# Patient Record
Sex: Female | Born: 1985
Health system: Southern US, Community
[De-identification: ages and names within clinical notes are randomized; demographics above are authoritative.]

## PROBLEM LIST (undated history)

## (undated) DIAGNOSIS — F32A Depression, unspecified: Secondary | ICD-10-CM

## (undated) DIAGNOSIS — R519 Headache, unspecified: Secondary | ICD-10-CM

## (undated) DIAGNOSIS — K259 Gastric ulcer, unspecified as acute or chronic, without hemorrhage or perforation: Secondary | ICD-10-CM

## (undated) DIAGNOSIS — F329 Major depressive disorder, single episode, unspecified: Secondary | ICD-10-CM

## (undated) DIAGNOSIS — F909 Attention-deficit hyperactivity disorder, unspecified type: Secondary | ICD-10-CM

## (undated) DIAGNOSIS — F419 Anxiety disorder, unspecified: Secondary | ICD-10-CM

## (undated) DIAGNOSIS — IMO0002 Reserved for concepts with insufficient information to code with codable children: Secondary | ICD-10-CM

## (undated) DIAGNOSIS — E079 Disorder of thyroid, unspecified: Secondary | ICD-10-CM

## (undated) HISTORY — DX: Depression, unspecified: F32.A

## (undated) HISTORY — DX: Reserved for concepts with insufficient information to code with codable children: IMO0002

## (undated) HISTORY — PX: NO PAST SURGERIES: SHX2092

## (undated) HISTORY — DX: Anxiety disorder, unspecified: F41.9

## (undated) HISTORY — DX: Attention-deficit hyperactivity disorder, unspecified type: F90.9

## (undated) HISTORY — PX: WISDOM TOOTH EXTRACTION: SHX21

## (undated) HISTORY — DX: Major depressive disorder, single episode, unspecified: F32.9

## (undated) HISTORY — DX: Disorder of thyroid, unspecified: E07.9

## (undated) HISTORY — DX: Gastric ulcer, unspecified as acute or chronic, without hemorrhage or perforation: K25.9

---
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
## 2007-01-19 ENCOUNTER — Ambulatory Visit: Payer: Self-pay | Admitting: Family Medicine

## 2007-02-06 ENCOUNTER — Telehealth: Payer: Self-pay | Admitting: Family Medicine

## 2007-03-21 ENCOUNTER — Telehealth: Payer: Self-pay | Admitting: Family Medicine

## 2007-04-18 ENCOUNTER — Ambulatory Visit: Payer: Self-pay | Admitting: Family Medicine

## 2007-04-18 DIAGNOSIS — J029 Acute pharyngitis, unspecified: Secondary | ICD-10-CM | POA: Insufficient documentation

## 2007-04-18 LAB — CONVERTED CEMR LAB
Heterophile Ab Screen: NEGATIVE
Rapid Strep: NEGATIVE

## 2007-04-23 LAB — CONVERTED CEMR LAB
ALT: 15 units/L (ref 0–35)
AST: 14 units/L (ref 0–37)
Albumin: 3.9 g/dL (ref 3.5–5.2)
Alkaline Phosphatase: 54 units/L (ref 39–117)
BUN: 2 mg/dL — ABNORMAL LOW (ref 6–23)
Basophils Absolute: 0 10*3/uL (ref 0.0–0.1)
Basophils Relative: 0.3 % (ref 0.0–1.0)
Bilirubin, Direct: 0.1 mg/dL (ref 0.0–0.3)
CO2: 31 meq/L (ref 19–32)
Calcium: 9.5 mg/dL (ref 8.4–10.5)
Chloride: 107 meq/L (ref 96–112)
Creatinine, Ser: 0.7 mg/dL (ref 0.4–1.2)
EBV NA IgG: 3.04 — ABNORMAL HIGH
EBV VCA IgG: 2.91 — ABNORMAL HIGH
EBV VCA IgM: 0.37
Eosinophils Absolute: 0.1 10*3/uL (ref 0.0–0.6)
Eosinophils Relative: 2.2 % (ref 0.0–5.0)
GFR calc Af Amer: 139 mL/min
GFR calc non Af Amer: 115 mL/min
Glucose, Bld: 74 mg/dL (ref 70–99)
HCT: 37.3 % (ref 36.0–46.0)
Hemoglobin: 12.9 g/dL (ref 12.0–15.0)
Lymphocytes Relative: 40.2 % (ref 12.0–46.0)
MCHC: 34.7 g/dL (ref 30.0–36.0)
MCV: 91.1 fL (ref 78.0–100.0)
Monocytes Absolute: 0.4 10*3/uL (ref 0.2–0.7)
Monocytes Relative: 7.8 % (ref 3.0–11.0)
Neutro Abs: 2.4 10*3/uL (ref 1.4–7.7)
Neutrophils Relative %: 49.5 % (ref 43.0–77.0)
Platelets: 217 10*3/uL (ref 150–400)
Potassium: 5 meq/L (ref 3.5–5.1)
RBC: 4.09 M/uL (ref 3.87–5.11)
RDW: 11.6 % (ref 11.5–14.6)
Sodium: 143 meq/L (ref 135–145)
Total Bilirubin: 0.7 mg/dL (ref 0.3–1.2)
Total Protein: 6.8 g/dL (ref 6.0–8.3)
WBC: 4.9 10*3/uL (ref 4.5–10.5)

## 2007-05-02 ENCOUNTER — Telehealth: Payer: Self-pay | Admitting: Family Medicine

## 2007-06-05 ENCOUNTER — Encounter: Payer: Self-pay | Admitting: Family Medicine

## 2007-06-05 ENCOUNTER — Encounter: Admission: RE | Admit: 2007-06-05 | Discharge: 2007-06-05 | Payer: Self-pay | Admitting: Family Medicine

## 2007-06-12 ENCOUNTER — Encounter: Payer: Self-pay | Admitting: Family Medicine

## 2007-06-18 ENCOUNTER — Encounter: Admission: RE | Admit: 2007-06-18 | Discharge: 2007-06-18 | Payer: Self-pay | Admitting: Urology

## 2007-06-19 ENCOUNTER — Telehealth: Payer: Self-pay | Admitting: Family Medicine

## 2007-07-23 ENCOUNTER — Telehealth: Payer: Self-pay | Admitting: Family Medicine

## 2007-07-24 ENCOUNTER — Telehealth: Payer: Self-pay | Admitting: Family Medicine

## 2007-08-01 ENCOUNTER — Telehealth: Payer: Self-pay | Admitting: Family Medicine

## 2007-10-24 ENCOUNTER — Telehealth: Payer: Self-pay | Admitting: Family Medicine

## 2007-11-27 ENCOUNTER — Telehealth: Payer: Self-pay | Admitting: Family Medicine

## 2008-01-14 ENCOUNTER — Telehealth: Payer: Self-pay | Admitting: Family Medicine

## 2008-01-24 ENCOUNTER — Ambulatory Visit: Payer: Self-pay | Admitting: Family Medicine

## 2008-02-19 DIAGNOSIS — N3010 Interstitial cystitis (chronic) without hematuria: Secondary | ICD-10-CM | POA: Insufficient documentation

## 2008-02-19 DIAGNOSIS — L708 Other acne: Secondary | ICD-10-CM | POA: Insufficient documentation

## 2008-04-23 ENCOUNTER — Telehealth: Payer: Self-pay | Admitting: Family Medicine

## 2008-05-01 ENCOUNTER — Encounter: Admission: RE | Admit: 2008-05-01 | Discharge: 2008-05-01 | Payer: Self-pay | Admitting: Family Medicine

## 2008-07-16 ENCOUNTER — Telehealth: Payer: Self-pay | Admitting: Family Medicine

## 2009-01-06 ENCOUNTER — Telehealth: Payer: Self-pay | Admitting: Family Medicine

## 2009-01-09 ENCOUNTER — Ambulatory Visit: Payer: Self-pay | Admitting: Family Medicine

## 2009-01-09 DIAGNOSIS — L708 Other acne: Secondary | ICD-10-CM | POA: Insufficient documentation

## 2009-01-09 DIAGNOSIS — R079 Chest pain, unspecified: Secondary | ICD-10-CM | POA: Insufficient documentation

## 2009-07-14 ENCOUNTER — Telehealth: Payer: Self-pay | Admitting: Family Medicine

## 2009-07-22 ENCOUNTER — Encounter: Payer: Self-pay | Admitting: Family Medicine

## 2009-08-03 ENCOUNTER — Telehealth: Payer: Self-pay | Admitting: Family Medicine

## 2010-02-08 ENCOUNTER — Telehealth: Payer: Self-pay | Admitting: Family Medicine

## 2010-02-10 ENCOUNTER — Ambulatory Visit: Payer: Self-pay | Admitting: Family Medicine

## 2010-07-11 NOTE — L&D Delivery Note (Signed)
Delivery Note  Several prolonged decels to 60's while pushing that resolved with position changes, NICU team was called to room secondary to decels   At 11:21 PM a viable female was delivered via Vaginal, Spontaneous Delivery (Presentation: ; Occiput Anterior).  Loose nuchal cord was reduced, there was a compound presentation of L arm Cord was doubly clamped and cut and infant was handed to awaiting NICU staff for assessment   APGAR: 7, 9; weight 6 lb 11.9 oz (3060 g).   Placenta status: Intact, Spontaneous.  Cord: 3 vessels with the following complications: None.    Anesthesia: Epidural  Episiotomy: None Lacerations: 1st degree Suture Repair: 3.0 vicryl rapide Est. Blood Loss (mL): 200  Mom to postpartum.  Baby to nursery-stable. Dr Estanislado Pandy notified Pt desires OP circ  Malissa Hippo 05/23/2011, 12:42 AM

## 2010-08-10 NOTE — Progress Notes (Signed)
Summary: REQ FOR REFILL ON MED  Phone Note Call from Patient   Caller: Patients Mom [phone] Reason for Call: Refill Medication Summary of Call: Pts mom called to adv that her daughter req a refill RX for her med (ADDERALL 20 MG  TABS).... Pts mom states that her daughters insurance Herbalist) has sent a letter that indicates that there may be a need for documentation stating that pt still needs the med in order to renew the RX.... Pts mom adv that if refill RX can be prepared for her daughter then she will come and pick it up.... Can you advise whether pt will need to come in for OV? (Last appt was in 01/2009).  Initial call taken by: Debbra Riding,  July 14, 2009 1:44 PM  Follow-up for Phone Call        I refilled this for 3 months Follow-up by: Nelwyn Salisbury MD,  July 15, 2009 5:18 PM  Additional Follow-up for Phone Call Additional follow up Details #1::        rx up front ready for p/u, pt aware Additional Follow-up by: Alfred Levins, CMA,  July 15, 2009 5:20 PM    New/Updated Medications: ADDERALL 20 MG  TABS (AMPHETAMINE-DEXTROAMPHETAMINE) two times a day ADDERALL 20 MG  TABS (AMPHETAMINE-DEXTROAMPHETAMINE) two times a day, may fill on 08-15-09 ADDERALL 20 MG  TABS (AMPHETAMINE-DEXTROAMPHETAMINE) two times a day, may fill on 09-12-09 Prescriptions: ADDERALL 20 MG  TABS (AMPHETAMINE-DEXTROAMPHETAMINE) two times a day, may fill on 09-12-09  #60 x 0   Entered and Authorized by:   Nelwyn Salisbury MD   Signed by:   Nelwyn Salisbury MD on 07/15/2009   Method used:   Print then Give to Patient   RxID:   [id number] ADDERALL 20 MG  TABS (AMPHETAMINE-DEXTROAMPHETAMINE) two times a day, may fill on 08-15-09  #60 x 0   Entered and Authorized by:   Nelwyn Salisbury MD   Signed by:   Nelwyn Salisbury MD on 07/15/2009   Method used:   Print then Give to Patient   RxID:   [id number] ADDERALL 20 MG  TABS (AMPHETAMINE-DEXTROAMPHETAMINE) two times a day  #60 x 0   Entered and Authorized  by:   Nelwyn Salisbury MD   Signed by:   Nelwyn Salisbury MD on 07/15/2009   Method used:   Print then Give to Patient   RxID:   [id number]   Appended Document: Prior Auth Need    Phone Note Call from Patient Call back at Home Phone [phone]   Caller: Mom- Walk in  Summary of Call: Mom states pt needs prior authorization for amphetamine salt combo 20mg .  Initial call taken by: Trixie Dredge,  July 23, 2009 11:01 AM  Follow-up for Phone Call        [facility] [id number])and was advised authorization for this med was approved from 07/01/09 - 07/22/2010, case # [id number].  Returned called to patient advising her of this. Follow-up by: Trixie Dredge,  July 23, 2009 11:03 AM

## 2010-08-10 NOTE — Progress Notes (Signed)
Summary: written rx for xanax  Phone Note Call from Patient Call back at Home Phone [phone]   Caller: Patient Call For: fry Summary of Call: pt wants a refill on her xanax, written rx for p/u, call when ready Initial call taken by: Alfred Levins, CMA,  August 03, 2009 5:29 PM  Follow-up for Phone Call        done Follow-up by: Nelwyn Salisbury MD,  August 04, 2009 1:24 PM  Additional Follow-up for Phone Call Additional follow up Details #1::        rx up front ready for p/u, pt aware Additional Follow-up by: Alfred Levins, CMA,  August 05, 2009 8:54 AM    New/Updated Medications: ALPRAZOLAM 1 MG TABS (ALPRAZOLAM) two times a day Prescriptions: ALPRAZOLAM 1 MG TABS (ALPRAZOLAM) two times a day  #60 x 5   Entered and Authorized by:   Nelwyn Salisbury MD   Signed by:   Nelwyn Salisbury MD on 08/04/2009   Method used:   Print then Give to Patient   RxID:   [id number]

## 2010-08-10 NOTE — Assessment & Plan Note (Signed)
Summary: st/?fever/njr  Medications Added VYVANSE 20 MG  CAPS (LISDEXAMFETAMINE DIMESYLATE) once daily        Vital Signs:  Patient Profile:   25 Years Old Female Weight:      121 pounds Temp:     97.9 degrees F oral Pulse rate:   80 / minute BP sitting:   120 / 80  (left arm) Cuff size:   regular  Vitals Entered By: Pura Spice, RN (April 18, 2007 10:35 AM)                 Chief Complaint:  sore throat x 1 1/2 wks. cough with yelowish green sputum.. Smoker 1/2 -1 ppd. .  History of Present Illness: 10 days of severe ST, body aches, intermittent fever, and slight cough producing yellow sputum. On Tylenol and fluids. Also wants to switch from Adderall to Vyvanse. Adderall always made her irritable, and her mother recently tried Vyvanse with good results.    Past Medical History:    Reviewed history from 02/09/2007 and no changes required:       Anxiety       ADHD     Review of Systems      See HPI   Physical Exam  General:     Well-developed,well-nourished,in no acute distress; alert,appropriate and cooperative throughout examination Head:     Normocephalic and atraumatic without obvious abnormalities. No apparent alopecia or balding. Eyes:     No corneal or conjunctival inflammation noted. EOMI. Perrla. Funduscopic exam benign, without hemorrhages, exudates or papilledema. Vision grossly normal. Ears:     External ear exam shows no significant lesions or deformities.  Otoscopic examination reveals clear canals, tympanic membranes are intact bilaterally without bulging, retraction, inflammation or discharge. Hearing is grossly normal bilaterally. Nose:     External nasal examination shows no deformity or inflammation. Nasal mucosa are pink and moist without lesions or exudates. Mouth:     pharyngeal erythema.  No exudate Neck:     No deformities, masses, or tenderness noted. Abdomen:     Bowel sounds positive,abdomen soft and non-tender without masses,  organomegaly or hernias noted. Cervical Nodes:     shotty bilateral nodes Psych:     Cognition and judgment appear intact. Alert and cooperative with normal attention span and concentration. No apparent delusions, illusions, hallucinations    Impression & Recommendations:  Problem # 1:  SORE THROAT (ICD-462)  Orders: Rapid Strep (45409) Monospot (81191) T-Culture, Throat (47829-56213) T- * Misc. Laboratory test [phone]) TLB-CBC Platelet - w/Differential (85025-CBCD) TLB-Hepatic/Liver Function Pnl (80076-HEPATIC) TLB-BMP (Basic Metabolic Panel-BMET) (80048-METABOL)   Problem # 2:  ADHD (ICD-314.01)  Complete Medication List: 1)  Xanax 0.25 Mg Tabs (Alprazolam) .... As needed 2)  Ortho Tri-cyclen Lo 0.025 Mg Tabs (Norgestimate-ethinyl estradiol) 3)  Erythromycin 2 % Soln (Erythromycin) 4)  Vyvanse 20 Mg Caps (Lisdexamfetamine dimesylate) .... Once daily  Other Orders: Nutrition Referral (Nutrition)   Patient Instructions: 1)  Please schedule a follow-up appointment as needed. 2)  Drink as much fluid as you can tolerate for the next few days. Wrote her out of school and work until at least 04-23-07.    Prescriptions: VYVANSE 20 MG  CAPS (LISDEXAMFETAMINE DIMESYLATE) once daily  #30 x 0   Entered and Authorized by:   Nelwyn Salisbury MD   Signed by:   Nelwyn Salisbury MD on 04/18/2007   Method used:   Print then Give to Patient   RxID:   [id number]  ]  Laboratory Results   Blood Tests      Mono: negative Comments: ...................................................................Milica Zimonjic  April 18, 2007 10:49 AM     Other Tests  Rapid Strep: negative Comments ...................................................................Milica Zimonjic  April 18, 2007 10:50 AM    Appended Document: st/?fever/njr

## 2010-08-10 NOTE — Progress Notes (Signed)
Summary: CHANGE IN RX  Medications Added VYVANSE 30 MG  CAPS (LISDEXAMFETAMINE DIMESYLATE) once daily       Phone Note Call from Patient Call back at Home Phone [phone]   Caller: Patient Call For: FRY Summary of Call: ON LAST VISIT PRESCRIBED  VYVANSE 20 MG SAID TO LET KNOW HOW THE DOSE IS DOING  20MG  DOES NOT SEEM LIKE ENOUGH  TAKES AT 6 AND BY 2 SHE IS DONE WHEN HE WRITES 3 MONTH RX PLEASE MOVE TO 30 MG Initial call taken by: Roselle Locus,  May 02, 2007 9:45 AM  Follow-up for Phone Call        I wrote for one month only of 30mg . Let me know how it does before I write for 3 months Follow-up by: Nelwyn Salisbury MD,  May 02, 2007 11:59 AM  Additional Follow-up for Phone Call Additional follow up Details #1::        Phone Call Completed Additional Follow-up by: Alfred Levins, CMA,  May 02, 2007 1:27 PM    New/Updated Medications: VYVANSE 30 MG  CAPS (LISDEXAMFETAMINE DIMESYLATE) once daily   Prescriptions: VYVANSE 30 MG  CAPS (LISDEXAMFETAMINE DIMESYLATE) once daily  #30 x 0   Entered and Authorized by:   Nelwyn Salisbury MD   Signed by:   Nelwyn Salisbury MD on 05/02/2007   Method used:   Print then Give to Patient   RxID:   [id number]

## 2010-08-10 NOTE — Progress Notes (Signed)
Summary: med refill  Phone Note Call from Patient Call back at Home Phone [phone]   Caller: Patient Call For: dr fry Summary of Call: pt is requesting written rx for generic adderall 10mg  #60  Initial call taken by: Heron Sabins,  February 06, 2007 2:17 PM  Follow-up for Phone Call        need chart please  Additional Follow-up for Phone Call Additional follow up Details #1::        done Additional Follow-up by: Nelwyn Salisbury MD,  February 07, 2007 5:49 PM    New/Updated Medications: ADDERALL 10 MG  TABS (AMPHETAMINE-DEXTROAMPHETAMINE) two times a day  Prescriptions: ADDERALL 10 MG  TABS (AMPHETAMINE-DEXTROAMPHETAMINE) two times a day  #60 x 0   Entered and Authorized by:   Nelwyn Salisbury MD   Signed by:   Nelwyn Salisbury MD on 02/07/2007   Method used:   Print then Give to Patient   RxID:   [id number]   Appended Document: med refill called patient at [phone] and LM that adderall rx is at front window for pickup.

## 2010-08-10 NOTE — Assessment & Plan Note (Signed)
Summary: fu on med/njr   Vital Signs:  Patient profile:   25 year old female Weight:      120 pounds BP sitting:   96 / 66  (left arm) Cuff size:   regular  Vitals Entered By: Raechel Ache, RN (February 10, 2010 1:43 PM) CC: F/u meds.   History of Present Illness: Here to follow up on ADHD and anxiety. She is doing well in general. She recently was diagnosed with asthma by a physician in [place], and of course he recommended she quit smoking. She is currently seeing an acuupuncture specialist to help with this. She is waitressing 6 days a week. Her moods are good, and she sleeps well.   Preventive Screening-Counseling & Management  Alcohol-Tobacco     Smoking Cessation Counseling: YES  Allergies: 1)  Codeine Phosphate (Codeine Phosphate)  Past History:  Past Medical History: Anxiety ADHD interstitial cystitis, sees Dr. Patsi Sears sees [facility] in [facility] for gyn exams acne Asthma, saw Dr. Lacie Scotts in [place], [place]  Past Surgical History: Reviewed history from 01/24/2008 and no changes required. Denies surgical history  Review of Systems  The patient denies anorexia, fever, weight loss, weight gain, vision loss, decreased hearing, hoarseness, chest pain, syncope, dyspnea on exertion, peripheral edema, prolonged cough, headaches, hemoptysis, abdominal pain, melena, hematochezia, severe indigestion/heartburn, hematuria, incontinence, genital sores, muscle weakness, suspicious skin lesions, transient blindness, difficulty walking, depression, unusual weight change, abnormal bleeding, enlarged lymph nodes, angioedema, breast masses, and testicular masses.    Physical Exam  General:  Well-developed,well-nourished,in no acute distress; alert,appropriate and cooperative throughout examination Neck:  No deformities, masses, or tenderness noted. Lungs:  Normal respiratory effort, chest expands symmetrically. Lungs are clear to auscultation, no crackles or wheezes. Heart:   Normal rate and regular rhythm. S1 and S2 normal without gallop, murmur, click, rub or other extra sounds. Neurologic:  alert & oriented X3, cranial nerves II-XII intact, and gait normal.   Psych:  Cognition and judgment appear intact. Alert and cooperative with normal attention span and concentration. No apparent delusions, illusions, hallucinations   Impression & Recommendations:  Problem # 1:  ASTHMA (ICD-493.90)  Her updated medication list for this problem includes:    Dulera 100-5 Mcg/act Aero (Mometasone furo-formoterol fum) ..... Use two times a day    Advair Diskus 100-50 Mcg/dose Aepb (Fluticasone-salmeterol) ..... Use once daily  Problem # 2:  ADHD (ICD-314.01)  Problem # 3:  ANXIETY (ICD-300.00)  Her updated medication list for this problem includes:    Alprazolam 1 Mg Tabs (Alprazolam) .Marland Kitchen..Marland Kitchen Two times a day  Complete Medication List: 1)  Erythromycin 2 % Soln (Erythromycin) .... Apply once daily 2)  Alprazolam 1 Mg Tabs (Alprazolam) .... Two times a day 3)  Adderall 20 Mg Tabs (Amphetamine-dextroamphetamine) .... Two times a day, may fill on 04-12-10 4)  Dulera 100-5 Mcg/act Aero (Mometasone furo-formoterol fum) .... Use two times a day 5)  Advair Diskus 100-50 Mcg/dose Aepb (Fluticasone-salmeterol) .... Use once daily  Patient Instructions: 1)  Meds were refilled.  2)  Tobacco is very bad for your health and your loved ones ! You should stop smoking !  Prescriptions: ADDERALL 20 MG  TABS (AMPHETAMINE-DEXTROAMPHETAMINE) two times a day, may fill on 04-12-10  #60 x 0   Entered and Authorized by:   Nelwyn Salisbury MD   Signed by:   Nelwyn Salisbury MD on 02/10/2010   Method used:   Print then Give to Patient   RxID:  [id number] ADDERALL 20 MG  TABS (AMPHETAMINE-DEXTROAMPHETAMINE) two times a day, may fill on 03-13-10  #60 x 0   Entered and Authorized by:   Nelwyn Salisbury MD   Signed by:   Nelwyn Salisbury MD on 02/10/2010   Method used:   Print then Give to Patient    RxID:   [id number] ADDERALL 20 MG  TABS (AMPHETAMINE-DEXTROAMPHETAMINE) two times a day  #60 x 0   Entered and Authorized by:   Nelwyn Salisbury MD   Signed by:   Nelwyn Salisbury MD on 02/10/2010   Method used:   Print then Give to Patient   RxID:   [id number] ALPRAZOLAM 1 MG TABS (ALPRAZOLAM) two times a day  #60 x 5   Entered and Authorized by:   Nelwyn Salisbury MD   Signed by:   Nelwyn Salisbury MD on 02/10/2010   Method used:   Print then Give to Patient   RxID:   [id number]

## 2010-08-10 NOTE — Progress Notes (Signed)
Summary: refiill  Medications Added VYVANSE 30 MG  CAPS (LISDEXAMFETAMINE DIMESYLATE) once daily, may fill on 09-01-07 VYVANSE 30 MG  CAPS (LISDEXAMFETAMINE DIMESYLATE) once daily, may fill on 09-29-07       Phone Note Call from Patient Call back at Home Phone [phone]   Caller: patient triage message Call For: Clent Ridges  Summary of Call: Vivanse written rx 30mg  qty 30 last fill 06-20-08 please call when rx can be picked up  Initial call taken by: Roselle Locus,  August 01, 2007 3:01 PM  Follow-up for Phone Call        done for 3 months Follow-up by: Nelwyn Salisbury MD,  August 01, 2007 4:34 PM  Additional Follow-up for Phone Call Additional follow up Details #1::        Phone Call Completed Additional Follow-up by: Alfred Levins, CMA,  August 01, 2007 4:37 PM    Orien Mayhall/Updated Medications: VYVANSE 30 MG  CAPS (LISDEXAMFETAMINE DIMESYLATE) once daily, may fill on 09-01-07 VYVANSE 30 MG  CAPS (LISDEXAMFETAMINE DIMESYLATE) once daily, may fill on 09-29-07   Prescriptions: VYVANSE 30 MG  CAPS (LISDEXAMFETAMINE DIMESYLATE) once daily, may fill on 09-29-07  #30 x 0   Entered and Authorized by:   Nelwyn Salisbury MD   Signed by:   Nelwyn Salisbury MD on 08/01/2007   Method used:   Print then Give to Patient   RxID:   [id number] VYVANSE 30 MG  CAPS (LISDEXAMFETAMINE DIMESYLATE) once daily, may fill on 09-01-07  #30 x 0   Entered and Authorized by:   Nelwyn Salisbury MD   Signed by:   Nelwyn Salisbury MD on 08/01/2007   Method used:   Print then Give to Patient   RxID:   [id number] VYVANSE 30 MG  CAPS (LISDEXAMFETAMINE DIMESYLATE) once daily  #30 x 0   Entered and Authorized by:   Nelwyn Salisbury MD   Signed by:   Nelwyn Salisbury MD on 08/01/2007   Method used:   Print then Give to Patient   RxID:   [id number]

## 2010-08-10 NOTE — Letter (Signed)
Summary: [facility]   Imported By: Kassie Mends 07/03/2007 13:19:51  _____________________________________________________________________  External Attachment:    Type:   Image     Comment:   [facility]

## 2010-08-10 NOTE — Medication Information (Signed)
Summary: Coverage Approval for Adderall/[employer]  Coverage Approval for Adderall/[employer]   Imported By: Maryln Gottron 07/27/2009 15:53:55  _____________________________________________________________________  External Attachment:    Type:   Image     Comment:   External Document

## 2010-08-10 NOTE — Progress Notes (Signed)
Summary: refill amphet salts & vyvanse  Phone Note Call from Patient Call back at [phone]   Call For: fry Reason for Call: Refill Medication Summary of Call: Adderall - Amphet Salts 10mg  two times a day #60 and Vyvanse 30mg  one daily #30 Initial call taken by: Rudy Jew, RN,  Nov 27, 2007 2:42 PM  Follow-up for Phone Call        we do not give her Adderall anymore but we changed to Vyvance last year. This is refilled for 3 months. Follow-up by: Nelwyn Salisbury MD,  Nov 27, 2007 6:03 PM  Additional Follow-up for Phone Call Additional follow up Details #1::        rx up front, pt aware Additional Follow-up by: Alfred Levins, CMA,  Nov 28, 2007 8:27 AM    New/Updated Medications: VYVANSE 30 MG  CAPS (LISDEXAMFETAMINE DIMESYLATE) once daily VYVANSE 30 MG  CAPS (LISDEXAMFETAMINE DIMESYLATE) once daily, may fill on 12-28-07 VYVANSE 30 MG  CAPS (LISDEXAMFETAMINE DIMESYLATE) once daily, may fill on 01-27-08   Prescriptions: VYVANSE 30 MG  CAPS (LISDEXAMFETAMINE DIMESYLATE) once daily, may fill on 01-27-08  #30 x 0   Entered and Authorized by:   Nelwyn Salisbury MD   Signed by:   Nelwyn Salisbury MD on 11/27/2007   Method used:   Print then Give to Patient   RxID:   [id number] VYVANSE 30 MG  CAPS (LISDEXAMFETAMINE DIMESYLATE) once daily, may fill on 12-28-07  #30 x 0   Entered and Authorized by:   Nelwyn Salisbury MD   Signed by:   Nelwyn Salisbury MD on 11/27/2007   Method used:   Print then Give to Patient   RxID:   [id number] VYVANSE 30 MG  CAPS (LISDEXAMFETAMINE DIMESYLATE) once daily  #30 x 0   Entered and Authorized by:   Nelwyn Salisbury MD   Signed by:   Nelwyn Salisbury MD on 11/27/2007   Method used:   Print then Give to Patient   RxID:   [id number] VYVANSE 30 MG  CAPS (LISDEXAMFETAMINE DIMESYLATE) once daily, may fill on 09-29-07  #30 x 0   Entered and Authorized by:   Nelwyn Salisbury MD   Signed by:   Nelwyn Salisbury MD on 11/27/2007   Method used:   Print then Give to  Patient   RxID:   [id number]     Appended Document: refill amphet salts & vyvanse    Phone Note Call from Patient Call back at Home Phone [phone]   Caller: Patient Call For: fry Summary of Call: She takes adderall 10mg  on the weekends because the vyvanse is to much.  She thought she told you that when at her office visit Initial call taken by: Alfred Levins, CMA,  Nov 28, 2007 10:22 AM  Follow-up for Phone Call        ok, wrote for #60 to supplement Vyvanse Follow-up by: Nelwyn Salisbury MD,  Nov 28, 2007 10:47 AM  Additional Follow-up for Phone Call Additional follow up Details #1::        rx up front, pt aware Additional Follow-up by: Alfred Levins, CMA,  Nov 28, 2007 10:48 AM    New/Updated Medications: ADDERALL 10 MG  TABS (AMPHETAMINE-DEXTROAMPHETAMINE) two times a day as needed   Prescriptions: ADDERALL 10 MG  TABS (AMPHETAMINE-DEXTROAMPHETAMINE) two times a day as needed  #60 x 0   Entered and Authorized by:   Nelwyn Salisbury MD   Signed  by:   Nelwyn Salisbury MD on 11/28/2007   Method used:   Print then Give to Patient   RxID:   [id number]

## 2010-08-10 NOTE — Progress Notes (Signed)
Summary: reill   Phone Note Call from Patient Call back at Home Phone [phone]   Caller: patient triage message Call For: fry Summary of Call: refill vyvanse 30mg   does she have to pick up written rx  or call in [facility] [phone] if she needs to picklup please call  Initial call taken by: Roselle Locus,  June 19, 2007 1:33 PM  Follow-up for Phone Call        she must pick it up. Done for one month. Follow-up by: Nelwyn Salisbury MD,  June 19, 2007 4:11 PM  Additional Follow-up for Phone Call Additional follow up Details #1::        Phone Call Completed Additional Follow-up by: Alfred Levins, CMA,  June 19, 2007 4:24 PM      Prescriptions: VYVANSE 30 MG  CAPS (LISDEXAMFETAMINE DIMESYLATE) once daily  #30 x 0   Entered and Authorized by:   Nelwyn Salisbury MD   Signed by:   Nelwyn Salisbury MD on 06/19/2007   Method used:   Print then Give to Patient   RxID:   [id number]

## 2010-08-10 NOTE — Letter (Signed)
Summary: [facility]  [facility]   Imported By: Kassie Mends 07/02/2007 16:29:39  _____________________________________________________________________  External Attachment:    Type:   Image     Comment:   [facility]

## 2010-08-10 NOTE — Progress Notes (Signed)
Summary: refill xanax  Phone Note From Pharmacy   Caller: [facility]. #[id number]* Call For: fry  Summary of Call: refill xanax 1mg  1 by mouth two times a day Initial call taken by: Alfred Levins, CMA,  January 06, 2009 1:02 PM  Follow-up for Phone Call        we got a request from her in April to send her records to [facility], so I assume she has a new doctor. What is the situation? Follow-up by: Nelwyn Salisbury MD,  January 06, 2009 4:29 PM  Additional Follow-up for Phone Call Additional follow up Details #1::        pt had switched physicians but has decided to stay with Dr Clent Ridges.  I explained that all her medical care would need to be with Dr Clent Ridges and she understood.  Appt was scheduled for Friday 11:15 Additional Follow-up by: Alfred Levins, CMA,  January 06, 2009 4:40 PM

## 2010-08-10 NOTE — Progress Notes (Signed)
Summary: RX   Phone Note Call from Patient Call back at [phone]   Caller: patient live Call For: fry Summary of Call: patient needs a rx refill for ortha trycilin low 1 day. she is out of them.  [facility]  [phone].  Please caLL Initial call taken by: Celine Ahr,  October 24, 2007 11:35 AM  Follow-up for Phone Call        PT CALLED BACK TODAY AND NEEDS HER BIRTHCONTROL [facility] [address]  [phone] ..................................................................Marland KitchenRoselle Locus  October 25, 2007 10:58 AM  Additional Follow-up for Phone Call Additional follow up Details #1::        Phone Call Completed Additional Follow-up by: Alfred Levins, CMA,  October 25, 2007 12:27 PM      Prescriptions: ORTHO TRI-CYCLEN LO 0.025 MG  TABS (NORGESTIMATE-ETHINYL ESTRADIOL) once daily  #30 x 1   Entered by:   Alfred Levins, CMA   Authorized by:   Nelwyn Salisbury MD   Signed by:   Alfred Levins, CMA on 10/25/2007   Method used:   Electronically sent to ...       [facility]  [phone]*       [address]       [place], [place]  [address]       Ph: [phone] or [phone]       Fax: [phone]   RxID:   [id number]

## 2010-08-10 NOTE — Progress Notes (Signed)
Summary: refill alprazolam  Phone Note Call from Patient Call back at [phone]   Caller: PT LIVE Call For: FRY Summary of Call: PATIENT WOULD LIKE RX REFILL FOR ALPRAZOLAM 1MG  QUIANTY 60.  [facility]. Initial call taken by: Celine Ahr,  July 16, 2008 2:47 PM  Follow-up for Phone Call        call in #60 with 5 rf Follow-up by: Nelwyn Salisbury MD,  July 17, 2008 12:00 PM  Additional Follow-up for Phone Call Additional follow up Details #1::        pharm called Additional Follow-up by: Sindy Guadeloupe RN,  July 17, 2008 12:17 PM    New/Updated Medications: ALPRAZOLAM 1 MG TABS (ALPRAZOLAM) two times a day   Prescriptions: ALPRAZOLAM 1 MG TABS (ALPRAZOLAM) two times a day  #60 x 5   Entered by:   Sindy Guadeloupe RN   Authorized by:   Nelwyn Salisbury MD   Signed by:   Sindy Guadeloupe RN on 07/17/2008   Method used:   Telephoned to ...       [facility]. [phone]* (retail)       [address]       [place], [place]  [address]       Ph: [phone]       Fax: [phone]   RxID:   [id number]

## 2010-08-10 NOTE — Progress Notes (Signed)
Summary: Alprazolam  Phone Note Call from Patient   Caller: Patient Call For: Dr. Clent Ridges Summary of Call: Requesting refill on Alprazolam........states that [facility] said we denied it. Please call her at [phone] Initial call taken by: Lynann Beaver CMA,  January 14, 2008 12:34 PM  Follow-up for Phone Call        we already called in a 6 month supply on 12-24-07 Follow-up by: Nelwyn Salisbury MD,  January 14, 2008 4:38 PM  Additional Follow-up for Phone Call Additional follow up Details #1::        Phone Call Completed Additional Follow-up by: Alfred Levins, CMA,  January 15, 2008 11:46 AM      Prescriptions: ORTHO TRI-CYCLEN LO 0.025 MG  TABS (NORGESTIMATE-ETHINYL ESTRADIOL) once daily  #30 x 0   Entered by:   Alfred Levins, CMA   Authorized by:   Nelwyn Salisbury MD   Signed by:   Alfred Levins, CMA on 01/15/2008   Method used:   Telephoned to ...       [facility] #[id number]*       [address]       [place], [place]  [address]       Ph: [phone]       Fax: [phone]   RxID:   [id number]

## 2010-08-10 NOTE — Assessment & Plan Note (Signed)
Summary: renew meds/cdw   Vital Signs:  Patient profile:   25 year old female Height:      68 inches Weight:      122 pounds BMI:     18.62 Temp:     98.3 degrees F oral Pulse rate:   77 / minute BP sitting:   98 / 64  (left arm) Cuff size:   regular  Vitals Entered By: Alfred Levins, CMA (January 09, 2009 11:33 AM) CC: renew meds, rt rib pain   History of Present Illness: Here for med refills and also for some right side pains. About 2 weeks ago after a large dog jumped on her she began to have intermittent sharp pains in the ribs on the right side. No SOB. takes Motrin at times. No urinary symptoms. Otherwise has been well.   Allergies: 1)  Codeine Phosphate (Codeine Phosphate)  Past History:  Past Medical History: Anxiety ADHD interstitial cystitis, sees Dr. Patsi Sears sees [facility] in [facility] for gyn exams acne  Past Surgical History: Reviewed history from 01/24/2008 and no changes required. Denies surgical history  Review of Systems  The patient denies anorexia, fever, weight loss, weight gain, vision loss, decreased hearing, hoarseness, chest pain, syncope, dyspnea on exertion, peripheral edema, prolonged cough, headaches, hemoptysis, abdominal pain, melena, hematochezia, severe indigestion/heartburn, hematuria, incontinence, genital sores, muscle weakness, suspicious skin lesions, transient blindness, difficulty walking, depression, unusual weight change, abnormal bleeding, enlarged lymph nodes, angioedema, breast masses, and testicular masses.    Physical Exam  General:  Well-developed,well-nourished,in no acute distress; alert,appropriate and cooperative throughout examination Chest Wall:  mildly tender at the inferior margin of right ribs. No crepitus Lungs:  Normal respiratory effort, chest expands symmetrically. Lungs are clear to auscultation, no crackles or wheezes. Heart:  Normal rate and regular rhythm. S1 and S2 normal without gallop, murmur,  click, rub or other extra sounds. Psych:  Cognition and judgment appear intact. Alert and cooperative with normal attention span and concentration. No apparent delusions, illusions, hallucinations   Impression & Recommendations:  Problem # 1:  RIB PAIN (ICD-786.50)  Problem # 2:  ACNE VULGARIS (ICD-706.1)  Her updated medication list for this problem includes:    Erythromycin 2 % Soln (Erythromycin) .Marland Kitchen... Apply once daily  Problem # 3:  ADHD (ICD-314.01)  Problem # 4:  ANXIETY (ICD-300.00)  Her updated medication list for this problem includes:    Alprazolam 1 Mg Tabs (Alprazolam) .Marland Kitchen..Marland Kitchen Two times a day  Complete Medication List: 1)  Ortho Tri-cyclen (28) 0.18/0.215/0.25 Mg-35 Mcg Tabs (Norgestim-eth estrad triphasic) .Marland Kitchen.. 1 by mouth once daily 2)  Erythromycin 2 % Soln (Erythromycin) .... Apply once daily 3)  Alprazolam 1 Mg Tabs (Alprazolam) .... Two times a day 4)  Adderall 20 Mg Tabs (Amphetamine-dextroamphetamine) .... Two times a day, may fill on 03-12-09 5)  Diclofenac Potassium 50 Mg Tabs (Diclofenac potassium) .... Three times a day as needed pain  Patient Instructions: 1)  rest, warm compresses, Doclofenac as needed . 2)  Please schedule a follow-up appointment as needed .  Prescriptions: ADDERALL 20 MG  TABS (AMPHETAMINE-DEXTROAMPHETAMINE) two times a day, may fill on 03-12-09  #60 x 0   Entered and Authorized by:   Nelwyn Salisbury MD   Signed by:   Nelwyn Salisbury MD on 01/09/2009   Method used:   Print then Give to Patient   RxID:   [id number] ADDERALL 20 MG  TABS (AMPHETAMINE-DEXTROAMPHETAMINE) two times a day, may fill on 02-09-09  #  60 x 0   Entered and Authorized by:   Nelwyn Salisbury MD   Signed by:   Nelwyn Salisbury MD on 01/09/2009   Method used:   Print then Give to Patient   RxID:   [id number] DICLOFENAC POTASSIUM 50 MG TABS (DICLOFENAC POTASSIUM) three times a day as needed pain  #60 x 2   Entered and Authorized by:   Nelwyn Salisbury MD   Signed by:    Nelwyn Salisbury MD on 01/09/2009   Method used:   Print then Give to Patient   RxID:   [id number] ADDERALL 20 MG  TABS (AMPHETAMINE-DEXTROAMPHETAMINE) two times a day  #60 x 0   Entered and Authorized by:   Nelwyn Salisbury MD   Signed by:   Nelwyn Salisbury MD on 01/09/2009   Method used:   Print then Give to Patient   RxID:   [id number] ALPRAZOLAM 1 MG TABS (ALPRAZOLAM) two times a day  #60 x 5   Entered and Authorized by:   Nelwyn Salisbury MD   Signed by:   Nelwyn Salisbury MD on 01/09/2009   Method used:   Print then Give to Patient   RxID:   [id number] ERYTHROMYCIN 2 %  SOLN (ERYTHROMYCIN) apply once daily  #30 days x 11   Entered and Authorized by:   Nelwyn Salisbury MD   Signed by:   Nelwyn Salisbury MD on 01/09/2009   Method used:   Print then Give to Patient   RxID:   [id number]

## 2010-08-10 NOTE — Progress Notes (Signed)
Summary: rx wrong mg  Medications Added XANAX 1 MG TABS (ALPRAZOLAM) 1 by mouth two times a day       Phone Note Call from Patient Call back at [phone]   Caller: patient mother live Call For: Clent Ridges  Summary of Call: Got her rx yesterday for her Xanax and they filled the RX before thry realized that it is wrong.  Should have been for #60 with 5 refills and 1 mg not .25mg .   Initial call taken by: Roselle Locus,  July 24, 2007 8:14 AM  Follow-up for Phone Call        please call her pharmacy and change this dose to 1 mg two times a day as above, and change this in her Echart Follow-up by: Nelwyn Salisbury MD,  July 24, 2007 8:23 AM  Additional Follow-up for Phone Call Additional follow up Details #1::        Phone Call Completed, Rx Called In Additional Follow-up by: Alfred Levins, CMA,  July 24, 2007 8:37 AM    New/Updated Medications: XANAX 1 MG TABS (ALPRAZOLAM) 1 by mouth two times a day   Prescriptions: XANAX 1 MG TABS (ALPRAZOLAM) 1 by mouth two times a day  #60 x 5   Entered by:   Alfred Levins, CMA   Authorized by:   Nelwyn Salisbury MD   Signed by:   Alfred Levins, CMA on 07/24/2007   Method used:   Telephoned to ...       [facility]  [address]  [phone]*       [address]       [place], [place]  [address]       Ph: [phone] or [phone]       Fax: [phone]   RxID:   [id number]

## 2010-08-10 NOTE — Progress Notes (Signed)
Summary: refill  Phone Note Call from Patient Call back at Home Phone [phone]   Caller: Patient---live call Summary of Call: need temporary refill alprazolam until her appt on wednesday. [facility] is [facility]---in [employer]---[phone]. Initial call taken by: Warnell Forester,  February 08, 2010 12:58 PM  Follow-up for Phone Call        pt called again. she is out of meds and cannot sleep. Follow-up by: Warnell Forester,  February 09, 2010 10:20 AM  Additional Follow-up for Phone Call Additional follow up Details #1::        call in #60 with no rf Additional Follow-up by: Nelwyn Salisbury MD,  February 09, 2010 1:48 PM    Prescriptions: ALPRAZOLAM 1 MG TABS (ALPRAZOLAM) two times a day  #60 x 0   Entered by:   Josph Macho RMA   Authorized by:   Nelwyn Salisbury MD   Signed by:   Josph Macho RMA on 02/09/2010   Method used:   Telephoned to ...       [facility] [address] [address] [phone]* (retail)       [address] [place], [place]  [id number]       Ph: [phone]       Fax: [phone]   RxID:   [id number]

## 2010-08-10 NOTE — Progress Notes (Signed)
Summary: Adderal  Phone Note Call from Patient   Caller: Patient Call For: Dr Clent Ridges Summary of Call: Pt needs written Adderal refills. [phone] Initial call taken by: Lynann Beaver CMA,  April 23, 2008 4:29 PM  Follow-up for Phone Call        done Follow-up by: Nelwyn Salisbury MD,  April 24, 2008 8:21 AM  Additional Follow-up for Phone Call Additional follow up Details #1::        rx up front, pt aware Additional Follow-up by: Alfred Levins, CMA,  April 24, 2008 8:41 AM    New/Updated Medications: ADDERALL 20 MG  TABS (AMPHETAMINE-DEXTROAMPHETAMINE) two times a day ADDERALL 20 MG  TABS (AMPHETAMINE-DEXTROAMPHETAMINE) two times a day, may fill on 05-25-08 ADDERALL 20 MG  TABS (AMPHETAMINE-DEXTROAMPHETAMINE) two times a day, may fill on 06-24-08   Prescriptions: ADDERALL 20 MG  TABS (AMPHETAMINE-DEXTROAMPHETAMINE) two times a day, may fill on 06-24-08  #60 x 0   Entered and Authorized by:   Nelwyn Salisbury MD   Signed by:   Nelwyn Salisbury MD on 04/24/2008   Method used:   Print then Give to Patient   RxID:   [id number] ADDERALL 20 MG  TABS (AMPHETAMINE-DEXTROAMPHETAMINE) two times a day, may fill on 05-25-08  #60 x 0   Entered and Authorized by:   Nelwyn Salisbury MD   Signed by:   Nelwyn Salisbury MD on 04/24/2008   Method used:   Print then Give to Patient   RxID:   [id number] ADDERALL 20 MG  TABS (AMPHETAMINE-DEXTROAMPHETAMINE) two times a day  #60 x 0   Entered and Authorized by:   Nelwyn Salisbury MD   Signed by:   Nelwyn Salisbury MD on 04/24/2008   Method used:   Print then Give to Patient   RxID:   [id number]

## 2010-08-10 NOTE — Assessment & Plan Note (Signed)
Summary: reck meds/jls   Vital Signs:  Patient Profile:   25 Years Old Female Weight:      112 pounds Temp:     98.0 degrees F oral Pulse rate:   87 / minute BP sitting:   100 / 60  (left arm) Cuff size:   regular  Vitals Entered By: Joanna Morgan, CMA (January 24, 2008 9:03 AM)                 Chief Complaint:  renew meds.  History of Present Illness: Here to refill meds and she has some questions. First we attempted to refill her Xanax last month, but the pharmacy has lost this. I called them and confirmed this. Second, Joanna Morgan would like to stop Vyvanse and go back to generic Adderall only. Part of this is due to side effects she gets from Vyvanse and part of this is due to its high cost. She thinks taking adderal two times a day will work well, but we will probably need to up the dose of this. She sleeps well. She is very busy with 2 jobs and taking classes.     Current Allergies (reviewed today): CODEINE PHOSPHATE (CODEINE PHOSPHATE)  Past Medical History:    Reviewed history from 04/18/2007 and no changes required:       Anxiety       ADHD       interstitial cystitis, sees Dr. Patsi Morgan       sees [facility] in [facility] gyn exams  Past Surgical History:    Reviewed history and no changes required:       Denies surgical history     Review of Systems      See HPI   Physical Exam  General:     Well-developed,well-nourished,in no acute distress; alert,appropriate and cooperative throughout examination Psych:     Cognition and judgment appear intact. Alert and cooperative with normal attention span and concentration. No apparent delusions, illusions, hallucinations    Impression & Recommendations:  Problem # 1:  ADHD (ICD-314.01)  Problem # 2:  ANXIETY (ICD-300.00)  Her updated medication list for this problem includes:    Xanax 1 Mg Tabs (Alprazolam) .Marland Kitchen... 1 by mouth two times a day   Complete Medication List: 1)  Ortho Tri-cyclen Lo  0.025 Mg Tabs (Norgestimate-ethinyl estradiol) .... Once daily 2)  Erythromycin 2 % Soln (Erythromycin) 3)  Xanax 1 Mg Tabs (Alprazolam) .Marland Kitchen.. 1 by mouth two times a day 4)  Adderall 20 Mg Tabs (Amphetamine-dextroamphetamine) .... Two times a day, may fill on 03-26-08   Patient Instructions: 1)  Stop Vyvanse, and increase Adderall to 20 mg two times a day . Refilled Xanax. 2)  Please schedule a follow-up appointment as needed.   Prescriptions: ADDERALL 20 MG  TABS (AMPHETAMINE-DEXTROAMPHETAMINE) two times a day, may fill on 03-26-08  #60 x 0   Entered and Authorized by:   Joanna Salisbury MD   Signed by:   Joanna Salisbury MD on 01/24/2008   Method used:   Print then Give to Patient   RxID:   [id number] ADDERALL 20 MG  TABS (AMPHETAMINE-DEXTROAMPHETAMINE) two times a day, may fill on 02-24-08  #60 x 0   Entered and Authorized by:   Joanna Salisbury MD   Signed by:   Joanna Salisbury MD on 01/24/2008   Method used:   Print then Give to Patient   RxID:   [id number] ADDERALL 20 MG  TABS (AMPHETAMINE-DEXTROAMPHETAMINE)  two times a day  #60 x 0   Entered and Authorized by:   Joanna Salisbury MD   Signed by:   Joanna Salisbury MD on 01/24/2008   Method used:   Print then Give to Patient   RxID:   [id number] [name] 1 MG TABS (ALPRAZOLAM) 1 by mouth two times a day  #60 x 5   Entered and Authorized by:   Joanna Salisbury MD   Signed by:   Joanna Salisbury MD on 01/24/2008   Method used:   Print then Give to Patient   RxID:   [id number]  ]

## 2010-09-14 ENCOUNTER — Telehealth: Payer: Self-pay | Admitting: Family Medicine

## 2010-09-14 MED ORDER — ALPRAZOLAM 1 MG PO TABS: 1.0000 mg | ORAL_TABLET | Freq: Two times a day (BID) | ORAL | Status: DC

## 2010-09-14 NOTE — Telephone Encounter (Signed)
Pt needs a refill on med: alprazolam.... Pt has appt for med review on 3/14 at 3:30pm.... [facility], [place], [employer].

## 2010-09-21 ENCOUNTER — Encounter: Payer: Self-pay | Admitting: Family Medicine

## 2010-09-22 ENCOUNTER — Encounter: Payer: Self-pay | Admitting: Family Medicine

## 2010-09-22 ENCOUNTER — Ambulatory Visit (INDEPENDENT_AMBULATORY_CARE_PROVIDER_SITE_OTHER): Payer: BC Managed Care – PPO | Admitting: Family Medicine

## 2010-09-22 VITALS — BP 100/60 | HR 96 | Temp 98.5°F | Wt 122.0 lb

## 2010-09-22 DIAGNOSIS — Z3400 Encounter for supervision of normal first pregnancy, unspecified trimester: Secondary | ICD-10-CM

## 2010-09-22 DIAGNOSIS — Z3200 Encounter for pregnancy test, result unknown: Secondary | ICD-10-CM

## 2010-09-22 MED ORDER — PRENATAL VITAMINS 0.8 MG PO TABS: 1.0000 | ORAL_TABLET | Freq: Every day | ORAL | Status: DC

## 2010-09-22 NOTE — Progress Notes (Signed)
  Subjective:    Patient ID: Joanna Morgan, female    DOB: [birth year], 25 y.o.   MRN: [id number]  HPI Here to get advice about pregnancy. She had a positive home pregnancy test last night. She had been 10 days late for her period, which is unusual for her. She feels good except for some slight nausea. She has already stopped smoking, and she never used alcohol anyway. She has stopped all her prescription meds, and has done well. She plans to see the GYN group in [place] where she has had exams in the past. She asks about any OTC meds she can take.    Review of Systems  Constitutional: Negative.   Respiratory: Negative.   Cardiovascular: Negative.   Gastrointestinal: Negative.   Genitourinary: Negative.   Psychiatric/Behavioral: Negative.        Objective:   Physical Exam  Constitutional: She is oriented to person, place, and time. She appears well-developed and well-nourished.  Cardiovascular: Normal rate, regular rhythm, normal heart sounds and intact distal pulses.   Pulmonary/Chest: Effort normal and breath sounds normal.  Neurological: She is alert and oriented to person, place, and time.  Psychiatric: She has a normal mood and affect. Her behavior is normal. Judgment and thought content normal.          Assessment & Plan:  She is pregnant and is very excited about this, as is her boyfriend. She has stopped smoking. She has stopped all her other meds and seems to have adjusted quite well. Start on prenatal vitamins.

## 2010-11-26 NOTE — Assessment & Plan Note (Signed)
[facility]                                 ON-CALL NOTE   NAME:Joanna Morgan, Joanna Morgan                      MRN:          [id number]  DATE:06/01/2006                            DOB:          [birth year]    The caller is Melba. She sees Dr. Clent Ridges.  Telephone [phone].   For the past 3 days the patient has experienced headache, body aches,  and chills. Also a low grade fever. Now today she has a very bad sore  throat and swollen glands around the neck. There is no nausea, vomiting  or diarrhea.   My answer is if she thinks she can wait until I can see her in the  clinic tomorrow morning, I suggested continuing fluids and using 800 mg  of Motrin every 6 hours.  Otherwise if she feels she cannot wait until  tomorrow, I suggested she go to Urgent Care today.     Tera Mater. Clent Ridges, MD  Electronically Signed    SAF/MedQ  DD: 06/01/2006  DT: 06/01/2006  Job #: [id number]

## 2010-12-30 LAB — RUBELLA ANTIBODY, IGM: Rubella: IMMUNE

## 2010-12-30 LAB — ABO/RH: RH Type: POSITIVE

## 2010-12-30 LAB — RPR: RPR: NONREACTIVE

## 2010-12-30 LAB — HEPATITIS B SURFACE ANTIGEN: Hepatitis B Surface Ag: NEGATIVE

## 2010-12-30 LAB — HIV ANTIBODY (ROUTINE TESTING W REFLEX): HIV: NONREACTIVE

## 2010-12-30 LAB — ANTIBODY SCREEN: Antibody Screen: NEGATIVE

## 2011-04-13 ENCOUNTER — Inpatient Hospital Stay (HOSPITAL_COMMUNITY)
Admission: AD | Admit: 2011-04-13 | Discharge: 2011-04-14 | Disposition: A | Discharge status: Home / Self Care | Payer: BC Managed Care – PPO | Source: Ambulatory Visit | Attending: Obstetrics and Gynecology | Admitting: Obstetrics and Gynecology

## 2011-04-13 ENCOUNTER — Encounter (HOSPITAL_COMMUNITY): Payer: Self-pay | Admitting: *Deleted

## 2011-04-13 DIAGNOSIS — N930 Postcoital and contact bleeding: Secondary | ICD-10-CM

## 2011-04-13 DIAGNOSIS — O4690 Antepartum hemorrhage, unspecified, unspecified trimester: Secondary | ICD-10-CM | POA: Insufficient documentation

## 2011-04-13 NOTE — Progress Notes (Signed)
Pt reports had gush of bright red bleeding during sex tonight. Now with blood on tissue.

## 2011-04-14 LAB — URINALYSIS, ROUTINE W REFLEX MICROSCOPIC
Bilirubin Urine: NEGATIVE
Glucose, UA: NEGATIVE mg/dL
Ketones, ur: NEGATIVE mg/dL
Leukocytes, UA: NEGATIVE
Nitrite: NEGATIVE
Protein, ur: NEGATIVE mg/dL
Specific Gravity, Urine: 1.02 (ref 1.005–1.030)
Urobilinogen, UA: 0.2 mg/dL (ref 0.0–1.0)
pH: 6 (ref 5.0–8.0)

## 2011-04-14 LAB — WET PREP, GENITAL
Trich, Wet Prep: NONE SEEN
Yeast Wet Prep HPF POC: NONE SEEN

## 2011-04-14 LAB — URINE MICROSCOPIC-ADD ON

## 2011-04-14 LAB — GC/CHLAMYDIA PROBE AMP, GENITAL
Chlamydia, DNA Probe: NEGATIVE
GC Probe Amp, Genital: NEGATIVE

## 2011-04-14 NOTE — ED Provider Notes (Signed)
History   Joanna Morgan is a 25y.o. White female, primagravida at 34.4 weeks who presents w/ CC of Vaginal bleeding during intercourse tonight.  Called to report incident when already in parking lot of [facility] around 2245.  Describes amount as "a lot," and bright red.  No current pain and no dyspareunia.  Reports h/o post-coital bleeding several times in pregnancy prior to tonight.  Ate around 2000.  No ctxs, LOF, PIH or UTI s/s.  No resp or GI c/o's.  Hx r/f:  1.  H/o abnl pap and LEEP  2.  H/o questionable colitis  3.  1st trimester spotting  4.  Asthma    Chief Complaint  Patient presents with  . Vaginal Bleeding   HPI  OB History    Grav Para Term Preterm Abortions TAB SAB Ect Mult Living   1               Past Medical History  Diagnosis Date  . Anxiety   . ADHD (attention deficit hyperactivity disorder)   . Asthma     Past Surgical History  Procedure Date  . No past surgeries     Family History  Problem Relation Age of Onset  . Depression      family hx  . Diabetes      family hx    History  Substance Use Topics  . Smoking status: Former Smoker    Quit date: 09/21/2010  . Smokeless tobacco: Not on file  . Alcohol Use: No    Allergies:  Allergies  Allergen Reactions  . Sulfa Antibiotics Shortness Of Breath  . Codeine Phosphate Rash    Prescriptions prior to admission  Medication Sig Dispense Refill  . acetaminophen (TYLENOL) 500 MG tablet Take 500 mg by mouth every 6 (six) hours as needed. For pain       . prenatal vitamin w/FE, FA (PRENATAL 1 + 1) 27-1 MG TABS Take 1 tablet by mouth daily.        . Prenatal Multivit-Min-Fe-FA (PRENATAL VITAMINS) 0.8 MG tablet Take 1 tablet by mouth daily.  30 tablet  11    ROS--see HPI Physical Exam   Blood pressure 105/61, pulse 73, temperature 98.6 F (37 C), resp. rate 18, height 5' 7.5" (1.715 m), weight 68.493 kg (151 lb).  Physical Exam  Constitutional: She is oriented to person, place, and time. She appears  well-developed and well-nourished. No distress.       anxious  Cardiovascular: Normal rate.   Respiratory: Effort normal.  GI: Soft. Bowel sounds are normal.  Genitourinary:       Scant amt BRB at introitus; sm amt pooling blood in vault--cleared w/ 2 fox swabs, superficial saturation; pt's panty liner w/ 3 superficial smears of blood.  No active bleeding from cx.  Cx:  Cl/long/-2 & ballotable, soft. Mons and vulva free of hair; no lesions.    Musculoskeletal: She exhibits no edema.  Neurological: She is alert and oriented to person, place, and time.  Skin: Skin is warm and dry.       Facial acne    MAU Course  Procedures 1.  NST--reactive w/ baseline 125, no decels, mod variability 2.  SSE 3.  Wet prep--few clue; WNL otherwise 4.  GC/CT--pending at time of d/c 5.  U/a--WNL  Assessment and Plan  1.  IUP at 34.4 2.  Vaginal bleeding during intercourse 3.  No further active bleeding in MAU 4.  B pos 5.  Reactive NST 6.  H/o LEEP 7.  H/o postcoital bleeding in pregnancy  1.  D/c home with bleeding and PTL precautions & FKC 2.  F/u Next Monday as scheduled or prn; pelvic rest until 7 days w/o vb or spotting   Joanna Morgan H 04/14/2011, 12:08 AM

## 2011-04-26 DIAGNOSIS — IMO0002 Reserved for concepts with insufficient information to code with codable children: Secondary | ICD-10-CM

## 2011-04-26 HISTORY — DX: Reserved for concepts with insufficient information to code with codable children: IMO0002

## 2011-05-22 ENCOUNTER — Encounter (HOSPITAL_COMMUNITY): Payer: Self-pay | Admitting: Anesthesiology

## 2011-05-22 ENCOUNTER — Encounter (HOSPITAL_COMMUNITY): Payer: Self-pay | Admitting: Obstetrics and Gynecology

## 2011-05-22 ENCOUNTER — Inpatient Hospital Stay (HOSPITAL_COMMUNITY): Payer: BC Managed Care – PPO | Admitting: Anesthesiology

## 2011-05-22 ENCOUNTER — Inpatient Hospital Stay (HOSPITAL_COMMUNITY)
Admission: AD | Admit: 2011-05-22 | Discharge: 2011-05-24 | DRG: 373 | Disposition: A | Discharge status: Home / Self Care | Payer: BC Managed Care – PPO | Source: Ambulatory Visit | Attending: Obstetrics and Gynecology | Admitting: Obstetrics and Gynecology

## 2011-05-22 DIAGNOSIS — O328XX0 Maternal care for other malpresentation of fetus, not applicable or unspecified: Secondary | ICD-10-CM | POA: Diagnosis present

## 2011-05-22 LAB — CBC
HCT: 37.3 % (ref 36.0–46.0)
Hemoglobin: 12.9 g/dL (ref 12.0–15.0)
MCH: 31.9 pg (ref 26.0–34.0)
MCHC: 34.6 g/dL (ref 30.0–36.0)
MCV: 92.1 fL (ref 78.0–100.0)
Platelets: 183 10*3/uL (ref 150–400)
RBC: 4.05 MIL/uL (ref 3.87–5.11)
RDW: 12.4 % (ref 11.5–15.5)
WBC: 9.8 10*3/uL (ref 4.0–10.5)

## 2011-05-22 LAB — AMNISURE RUPTURE OF MEMBRANE (ROM) NOT AT ARMC: Amnisure ROM: POSITIVE

## 2011-05-22 LAB — RPR: RPR Ser Ql: NONREACTIVE

## 2011-05-22 MED ORDER — FENTANYL 2.5 MCG/ML BUPIVACAINE 1/10 % EPIDURAL INFUSION (WH - ANES)
14.0000 mL/h | INTRAMUSCULAR | Status: DC
  Administered 2011-05-22 (×2): 14 mL/h via EPIDURAL
  Filled 2011-05-22 (×2): qty 60

## 2011-05-22 MED ORDER — LIDOCAINE HCL (PF) 1 % IJ SOLN
30.0000 mL | INTRAMUSCULAR | Status: DC | PRN
  Filled 2011-05-22: qty 30

## 2011-05-22 MED ORDER — CITRIC ACID-SODIUM CITRATE 334-500 MG/5ML PO SOLN
30.0000 mL | ORAL | Status: DC | PRN
  Filled 2011-05-22: qty 15

## 2011-05-22 MED ORDER — TERBUTALINE SULFATE 1 MG/ML IJ SOLN
INTRAMUSCULAR | Status: AC
  Administered 2011-05-22: 0.25 mg
  Filled 2011-05-22: qty 1

## 2011-05-22 MED ORDER — LACTATED RINGERS IV SOLN: 500.0000 mL | Freq: Once | INTRAVENOUS | Status: DC

## 2011-05-22 MED ORDER — LACTATED RINGERS IV SOLN
INTRAVENOUS | Status: DC
  Administered 2011-05-22: 14:00:00 via INTRAVENOUS
  Administered 2011-05-22: 125 mL/h via INTRAVENOUS

## 2011-05-22 MED ORDER — OXYTOCIN 20 UNITS IN LACTATED RINGERS INFUSION - SIMPLE
125.0000 mL/h | Freq: Once | INTRAVENOUS | Status: AC
  Administered 2011-05-22: 125 mL/h via INTRAVENOUS

## 2011-05-22 MED ORDER — DIPHENHYDRAMINE HCL 50 MG/ML IJ SOLN: 12.5000 mg | INTRAMUSCULAR | Status: DC | PRN

## 2011-05-22 MED ORDER — OXYCODONE-ACETAMINOPHEN 5-325 MG PO TABS: 2.0000 | ORAL_TABLET | ORAL | Status: DC | PRN

## 2011-05-22 MED ORDER — LACTATED RINGERS IV SOLN
500.0000 mL | INTRAVENOUS | Status: DC | PRN
  Administered 2011-05-22: 500 mL via INTRAVENOUS
  Administered 2011-05-22 (×2): 1000 mL via INTRAVENOUS

## 2011-05-22 MED ORDER — OXYTOCIN BOLUS FROM INFUSION
500.0000 mL | Freq: Once | INTRAVENOUS | Status: DC
  Filled 2011-05-22: qty 1000
  Filled 2011-05-22: qty 500

## 2011-05-22 MED ORDER — BUTORPHANOL TARTRATE 2 MG/ML IJ SOLN: 1.0000 mg | INTRAMUSCULAR | Status: DC | PRN

## 2011-05-22 MED ORDER — EPHEDRINE 5 MG/ML INJ
10.0000 mg | INTRAVENOUS | Status: DC | PRN
  Filled 2011-05-22: qty 4

## 2011-05-22 MED ORDER — PHENYLEPHRINE 40 MCG/ML (10ML) SYRINGE FOR IV PUSH (FOR BLOOD PRESSURE SUPPORT): 80.0000 ug | PREFILLED_SYRINGE | INTRAVENOUS | Status: DC | PRN

## 2011-05-22 MED ORDER — IBUPROFEN 600 MG PO TABS: 600.0000 mg | ORAL_TABLET | Freq: Four times a day (QID) | ORAL | Status: DC | PRN

## 2011-05-22 MED ORDER — ONDANSETRON HCL 4 MG/2ML IJ SOLN
4.0000 mg | Freq: Four times a day (QID) | INTRAMUSCULAR | Status: DC | PRN
  Administered 2011-05-22: 4 mg via INTRAVENOUS
  Filled 2011-05-22: qty 2

## 2011-05-22 MED ORDER — FLEET ENEMA 7-19 GM/118ML RE ENEM: 1.0000 | ENEMA | RECTAL | Status: DC | PRN

## 2011-05-22 MED ORDER — ACETAMINOPHEN 325 MG PO TABS: 650.0000 mg | ORAL_TABLET | ORAL | Status: DC | PRN

## 2011-05-22 MED ORDER — LIDOCAINE HCL 1.5 % IJ SOLN
INTRAMUSCULAR | Status: DC | PRN
  Administered 2011-05-22 (×2): 5 mL via EPIDURAL

## 2011-05-22 MED ORDER — PHENYLEPHRINE 40 MCG/ML (10ML) SYRINGE FOR IV PUSH (FOR BLOOD PRESSURE SUPPORT)
80.0000 ug | PREFILLED_SYRINGE | INTRAVENOUS | Status: DC | PRN
  Filled 2011-05-22: qty 5

## 2011-05-22 MED ORDER — EPHEDRINE 5 MG/ML INJ
10.0000 mg | INTRAVENOUS | Status: AC | PRN
  Administered 2011-05-22 (×2): 10 mg via INTRAVENOUS

## 2011-05-22 NOTE — Anesthesia Preprocedure Evaluation (Signed)
Anesthesia Evaluation  Patient identified by MRN, date of birth, ID band Patient awake    Reviewed: Allergy & Precautions, H&P , Patient's Chart, lab work & pertinent test results  Airway Mallampati: II TM Distance: >3 FB Neck ROM: full    Dental No notable dental hx.    Pulmonary neg pulmonary ROS,  clear to auscultation  Pulmonary exam normal       Cardiovascular neg cardio ROS regular Normal    Neuro/Psych PSYCHIATRIC DISORDERS Negative Neurological ROS  Negative Psych ROS   GI/Hepatic negative GI ROS, Neg liver ROS,   Endo/Other  Negative Endocrine ROS  Renal/GU negative Renal ROS     Musculoskeletal   Abdominal   Peds  Hematology negative hematology ROS (+)   Anesthesia Other Findings   Reproductive/Obstetrics (+) Pregnancy                           Anesthesia Physical Anesthesia Plan  ASA: II  Anesthesia Plan: Epidural   Post-op Pain Management:    Induction:   Airway Management Planned:   Additional Equipment:   Intra-op Plan:   Post-operative Plan:   Informed Consent: I have reviewed the patients History and Physical, chart, labs and discussed the procedure including the risks, benefits and alternatives for the proposed anesthesia with the patient or authorized representative who has indicated his/her understanding and acceptance.     Plan Discussed with:   Anesthesia Plan Comments:         Anesthesia Quick Evaluation  

## 2011-05-22 NOTE — Anesthesia Procedure Notes (Signed)

## 2011-05-22 NOTE — H&P (Signed)
Joanna Morgan is a 25 y.o. female presenting with questionable SROM since last night, with another questionable episode of leaking this am.  Reports irregular contractions, cervix has been 3 cm earlier this week.  Pregnancy remarkable for: Transfer in at 20 weeks from [place] GBS negative Asthma Hx LEEP Hx ? Colitis Previous smoker Hx anxiety Cervical polyp seen at 36 weeks--plan resection at delivery per Dr. Normand Sloop note  OB History    Grav Para Term Preterm Abortions TAB SAB Ect Mult Living   1 0 0 0 0 0 0 0 0 0      Past Medical History  Diagnosis Date  . Anxiety   . ADHD (attention deficit hyperactivity disorder)   . Asthma   Hx OCP and condom use in past; +HPV; Asthma usually exacerbated by smoking--no problems since stopping. Hx stomach ulcer, ? Colitis in past (?ischemic colitis?, but no routine meds).  ? Interstitial cystitis in past, no issues now.   Past Surgical History  Procedure Date  . No past surgeries   Wisdom teeth 2009  Family History: family history includes Depression in an unspecified family member and Diabetes in an unspecified family member.  Father with paranoid schizophrenia; MU liver cancer; mother cancer of eye; father and mother smokers; sister hx drug abuse in past; Paternal 1st cousin trisomy 8  Social History:  reports that she quit smoking about 7 months ago. She does not have any smokeless tobacco history on file. She reports that she does not drink alcohol. Her drug history not on file.  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  Patient has been followed by the CNM service at [facility].  ROS:  Occasional contractions, leaking clear fluid.    Blood pressure 118/70, pulse 92, resp. rate 16, height 5\' 8"  (1.727 m), weight 70.943 kg (156 lb 6.4 oz).   Prenatal labs: ABO, Rh:  B+ Antibody:   Neg Rubella:  Immune RPR:   NR HBsAg:   neg HIV:   Heg GBS:   negative Varicella immune CF neg Toxoplasmosis neg Cultures negative at NOB and 36 weeks Quad screen negative Glucola 126 Hgb 12.7 at NOB/13.1 at 28 weeks.   Physical exam; Chest clear Heart RRR without murmur Abd gravid, NT, mild contractions palpated FHR reactive initially, then to sleep cycle with baseline 120s, with one decel with 2 closely spaced UCs, then reactive again. UCs irregular, q 4-6 mile. Cervix 5 cm, 80%, vtx 0 station, BBOW. No cervical polyp seen Amnisure done Fern negative. Moderate amount cervical mucus in vault.  Assessment/Plan: IUP at 40 weeks ?SROM Advanced cervical dilation, early labor. GBS negative  Plan: Admit to Birthing Suite per consult with Dr. Estanislado Pandy Routine CNM orders Plan observation of labor status at present, then AROM as appropriate. Pain medication prn. Will evaluate for presence of cervical polyp at delivery.  Consult regarding management if present.  Nigel Bridgeman 05/22/2011, 12:37 PM

## 2011-05-22 NOTE — Progress Notes (Signed)
  Subjective: More comfortable than before epidural, but now having increased pain on right side (has been on left side since just after epidural placed).  Received SQ Terb at 1724.  Objective: BP 121/67  Pulse 70  Temp(Src) 98.2 F (36.8 C) (Oral)  Resp 20  Ht 5\' 8"  (1.727 m)  Wt 70.943 kg (156 lb 6.4 oz)  BMI 23.78 kg/m2      FHT:  FHR: 120s bpm, variability: moderate,  accelerations:  Present,  decelerations:  Absent UC:   regular, every 3-5 minutes SVE:   Dilation: 7.5 Effacement (%): 100 Station: +1 Exam by:: Xxavier Noon cnm Able to better evaluate the cervix now (compared to previous exam with patient on far left side)  Assessment / Plan: Stable FHR S/p Terbutaline for tetanic UCs Active labo  Will re-evaluate cervix as Terbutaline effects dissipates, Foley placed Patient repositioned, epidural PCA used.  Nigel Bridgeman 05/22/2011, 6:40 PM

## 2011-05-22 NOTE — Progress Notes (Signed)
Pt presents to MAU with chief complaint of questionable ROM around 10:00 am . Pt is a G1 at [redacted]w[redacted]d. No contractions at this time.

## 2011-05-22 NOTE — Progress Notes (Signed)
  Subjective: More comfortable now--still aware of UCs, but not painful.  Family at bedside.  Objective: BP 119/75  Pulse 84  Temp(Src) 98.3 F (36.8 C) (Oral)  Resp 20  Ht 5\' 8"  (1.727 m)  Wt 156 lb 6.4 oz (70.943 kg)  BMI 23.78 kg/m2   Total I/O In: -  Out: 250 [Urine:250]  Current temp 99.0 FHT:  FHR: 115-125 bpm, variability: moderate,  accelerations:  Present,  decelerations:  Present Single variable decel with UC. UC:   regular, every 2-4 minutes, moderate/strong--better pattern and quality in last hour. VE:  9.5 cm, 100%, vtx, +1 More cervix on right than left--patient repositioned on right side to facilitate rotation and descent.  Assessment / Plan: Spontaneous labor, progressing well Category 1 FHR  Will continue to observe.  Nigel Bridgeman 05/22/2011, 8:42 PM

## 2011-05-22 NOTE — Progress Notes (Signed)
  Subjective: Just returned from walking--reports UCs same.  Some bloody show noted.  Objective: BP 110/65  Pulse 85  Temp(Src) 97.7 F (36.5 C) (Oral)  Resp 20  Ht 5\' 8"  (1.727 m)  Wt 70.943 kg (156 lb 6.4 oz)  BMI 23.78 kg/m2      FHT:  FHR: 130s bpm, variability: moderate,  accelerations:  Present,  decelerations:  Absent UC:   regular, every 4-6 minutes SVE:   Dilation: 5 Effacement (%): 100 Station: 0 Exam by:: Galilee Pierron cnm BBOW, AROM, clear fluid.     Assessment / Plan: Spontaneous labor, progressing normally Will continue to observe--declines pain medication at present.  Deland Slocumb 05/22/2011, 4:00 PM

## 2011-05-22 NOTE — Progress Notes (Signed)
  Subjective/Objective Epidural placed at approx 1715, with subsequent decel noted when monitor replaced after dosing--appeared to be associated with tetanic contraction (patient not on pitocin).  FHR down to 50-70 x 8 minutes, with cervical exam, O2, Terb, position change, scalp stim, ephedrine, and placement of scalp lead.  Decel resolved with resolution of tetanic contractions, with resolution to baseline of 120-130, moderate variability, no decels.  Cervical exam is rim, 100%, vtx, +1 station.  Objective: BP 117/53  Pulse 108  Temp(Src) 98.2 F (36.8 C) (Oral)  Resp 20  Ht 5\' 8"  (1.727 m)  Wt 70.943 kg (156 lb 6.4 oz)  BMI 23.78 kg/m2      FHT:  FHR: 120-130 bpm, variability: moderate,  accelerations:  Present,  decelerations:  Absent UC:   regular, every 2-3 minutes SVE:   Dilation: Lip/rim Effacement (%): 100 Station: +1 Exam by:: Kili Gracy cnm    Assessment / Plan: Rapid labor progress, with tetanic contractions and subsequent decel--now resolved to Category I tracing.  Will continue to observe closely Dr. Estanislado Pandy updated.    Nigel Bridgeman 05/22/2011, 6:02 PM

## 2011-05-22 NOTE — Progress Notes (Signed)
.  Subjective: Comfortable, feels some pressure but not strong urge to push,    Objective: BP 108/58  Pulse 76  Temp(Src) 99 F (37.2 C) (Oral)  Resp 20  Ht 5\' 8"  (1.727 m)  Wt 70.943 kg (156 lb 6.4 oz)  BMI 23.78 kg/m2   FHT:  FHR: 125 bpm, variability: moderate,  accelerations:  Present,  decelerations:  Present early decels UC:   regular, every 2-4 minutes SVE:   Dilation: Lip/rim Effacement (%): 100 Station: +2 Exam by:: Manfred Arch, CNM    Assessment / Plan: Spontaneous labor, progressing normally GBS neg  Will labor down until stronger urge to push  Fetal Wellbeing:  Category I Pain Control:  Epidural  Dr Estanislado Pandy updated per Emilee Hero, CNM  Chessa Barrasso M 05/22/2011, 9:29 PM

## 2011-05-22 NOTE — Progress Notes (Signed)
  Subjective: Now on [employer].  Reports contractions slightly stronger now.  Amnisure positive.    Objective: BP 107/59  Pulse 73  Temp(Src) 98.4 F (36.9 C) (Oral)  Resp 20  Ht 5\' 8"  (1.727 m)  Wt 70.943 kg (156 lb 6.4 oz)  BMI 23.78 kg/m2      FHT:  FHR: 130 bpm, variability: moderate,  accelerations:  Present,  decelerations:  Absent UC:   regular, every 4 minutes  Labs: Lab Results  Component Value Date   WBC 9.8 05/22/2011   HGB 12.9 05/22/2011   HCT 37.3 05/22/2011   MCV 92.1 05/22/2011   PLT 183 05/22/2011    Assessment / Plan: Early/active labor SROM verified by amnisure GBS negative  Plan: Continue current care. Will ambulate at present. Re-evaluate between 3-3:30, with AROM as option to be considered. Dr. Estanislado Pandy updated.   Trip Cavanagh 05/22/2011, 2:10 PM

## 2011-05-22 NOTE — Progress Notes (Signed)
Patient reports ?leaking fluid since yesterday today had gush of fluid, lower abdominal discomfort, no contractions 40 weeks.

## 2011-05-23 LAB — CBC
HCT: 32.5 % — ABNORMAL LOW (ref 36.0–46.0)
Hemoglobin: 11.3 g/dL — ABNORMAL LOW (ref 12.0–15.0)
MCH: 32.1 pg (ref 26.0–34.0)
MCHC: 34.8 g/dL (ref 30.0–36.0)
MCV: 92.3 fL (ref 78.0–100.0)
Platelets: 126 10*3/uL — ABNORMAL LOW (ref 150–400)
RBC: 3.52 MIL/uL — ABNORMAL LOW (ref 3.87–5.11)
RDW: 12.6 % (ref 11.5–15.5)
WBC: 10.5 10*3/uL (ref 4.0–10.5)

## 2011-05-23 MED ORDER — DIPHENHYDRAMINE HCL 25 MG PO CAPS: 25.0000 mg | ORAL_CAPSULE | Freq: Four times a day (QID) | ORAL | Status: DC | PRN

## 2011-05-23 MED ORDER — ONDANSETRON HCL 4 MG PO TABS: 4.0000 mg | ORAL_TABLET | ORAL | Status: DC | PRN

## 2011-05-23 MED ORDER — TETANUS-DIPHTH-ACELL PERTUSSIS 5-2.5-18.5 LF-MCG/0.5 IM SUSP
0.5000 mL | Freq: Once | INTRAMUSCULAR | Status: AC
  Administered 2011-05-24: 0.5 mL via INTRAMUSCULAR
  Filled 2011-05-23 (×2): qty 0.5

## 2011-05-23 MED ORDER — ZOLPIDEM TARTRATE 5 MG PO TABS: 5.0000 mg | ORAL_TABLET | Freq: Every evening | ORAL | Status: DC | PRN

## 2011-05-23 MED ORDER — IBUPROFEN 600 MG PO TABS
600.0000 mg | ORAL_TABLET | Freq: Four times a day (QID) | ORAL | Status: DC
  Administered 2011-05-23 – 2011-05-24 (×6): 600 mg via ORAL
  Filled 2011-05-23 (×6): qty 1

## 2011-05-23 MED ORDER — ONDANSETRON HCL 4 MG/2ML IJ SOLN: 4.0000 mg | INTRAMUSCULAR | Status: DC | PRN

## 2011-05-23 MED ORDER — SIMETHICONE 80 MG PO CHEW: 80.0000 mg | CHEWABLE_TABLET | ORAL | Status: DC | PRN

## 2011-05-23 MED ORDER — ACETAMINOPHEN 500 MG PO TABS
1000.0000 mg | ORAL_TABLET | Freq: Four times a day (QID) | ORAL | Status: DC | PRN
  Administered 2011-05-23 – 2011-05-24 (×2): 1000 mg via ORAL
  Filled 2011-05-23 (×2): qty 2

## 2011-05-23 MED ORDER — SENNOSIDES-DOCUSATE SODIUM 8.6-50 MG PO TABS
2.0000 | ORAL_TABLET | Freq: Every day | ORAL | Status: DC
  Administered 2011-05-23: 2 via ORAL

## 2011-05-23 MED ORDER — LANOLIN HYDROUS EX OINT: TOPICAL_OINTMENT | CUTANEOUS | Status: DC | PRN

## 2011-05-23 MED ORDER — BENZOCAINE-MENTHOL 20-0.5 % EX AERO
1.0000 "application " | INHALATION_SPRAY | CUTANEOUS | Status: DC | PRN
  Administered 2011-05-23: 1 via TOPICAL

## 2011-05-23 MED ORDER — BENZOCAINE-MENTHOL 20-0.5 % EX AERO
INHALATION_SPRAY | CUTANEOUS | Status: AC
  Filled 2011-05-23: qty 56

## 2011-05-23 MED ORDER — OXYCODONE-ACETAMINOPHEN 5-325 MG PO TABS: 1.0000 | ORAL_TABLET | ORAL | Status: DC | PRN

## 2011-05-23 MED ORDER — PRENATAL PLUS 27-1 MG PO TABS
1.0000 | ORAL_TABLET | Freq: Every day | ORAL | Status: DC
  Administered 2011-05-23 – 2011-05-24 (×2): 1 via ORAL
  Filled 2011-05-23 (×2): qty 1

## 2011-05-23 MED ORDER — WITCH HAZEL-GLYCERIN EX PADS: 1.0000 "application " | MEDICATED_PAD | CUTANEOUS | Status: DC | PRN

## 2011-05-23 MED ORDER — MEASLES, MUMPS & RUBELLA VAC ~~LOC~~ INJ
0.5000 mL | INJECTION | Freq: Once | SUBCUTANEOUS | Status: DC
  Filled 2011-05-23: qty 0.5

## 2011-05-23 MED ORDER — DIBUCAINE 1 % RE OINT: 1.0000 "application " | TOPICAL_OINTMENT | RECTAL | Status: DC | PRN

## 2011-05-23 NOTE — Anesthesia Postprocedure Evaluation (Signed)
  Anesthesia Post-op Note  Patient: Joanna Morgan  Procedure(s) Performed: * No procedures listed *  Patient Location: Mother/Baby  Anesthesia Type: Epidural  Level of Consciousness: awake  Airway and Oxygen Therapy: Patient Spontanous Breathing  Post-op Pain: mild  Post-op Assessment: Patient's Cardiovascular Status Stable and Respiratory Function Stable  Post-op Vital Signs: stable  Complications: No apparent anesthesia complications

## 2011-05-23 NOTE — Progress Notes (Signed)
Post Partum Day 1 Subjective: no complaints, up ad lib and voiding  Objective: Blood pressure 100/68, pulse 78, temperature 98 F (36.7 C), temperature source Oral, resp. rate 18, height 5\' 8"  (1.727 m), weight 156 lb 6.4 oz (70.943 kg), unknown if currently breastfeeding.  Physical Exam:  General: alert and cooperative Lochia: appropriate Uterine Fundus: firm Incision: healing well DVT Evaluation: Negative Homan's sign.   Basename 05/23/11 0535 05/22/11 1350  HGB 11.3* 12.9  HCT 32.5* 37.3    Assessment/Plan: Post partum day 1 lactating Plan for discharge tomorrow and Breastfeeding   LOS: 1 day   Takeria Marquina 05/23/2011, 2:17 PM

## 2011-05-23 NOTE — Anesthesia Postprocedure Evaluation (Signed)
  Anesthesia Post-op Note  Patient: Joanna Morgan  Procedure(s) Performed: * No procedures listed *  Patient Location: Mother/Baby  Anesthesia Type: Epidural  Level of Consciousness: alert   Airway and Oxygen Therapy: Patient Spontanous Breathing  Post-op Pain: mild  Post-op Assessment: Patient's Cardiovascular Status Stable and Respiratory Function Stable  Post-op Vital Signs: stable  Complications: No apparent anesthesia complications

## 2011-05-24 MED ORDER — NORETHINDRONE 0.35 MG PO TABS: 1.0000 | ORAL_TABLET | Freq: Every day | ORAL | Status: DC

## 2011-05-24 MED ORDER — TRAMADOL HCL 50 MG PO TABS: 50.0000 mg | ORAL_TABLET | Freq: Four times a day (QID) | ORAL | Status: AC | PRN

## 2011-05-24 MED ORDER — IBUPROFEN 200 MG PO TABS: 800.0000 mg | ORAL_TABLET | Freq: Three times a day (TID) | ORAL | Status: AC | PRN

## 2011-05-24 NOTE — Discharge Summary (Signed)
Obstetric Discharge Summary Reason for Admission: onset of labor and rupture of membranes Prenatal Procedures: NST and ultrasound Intrapartum Procedures: spontaneous vaginal delivery Postpartum Procedures: none Complications-Operative and Postpartum: 1 degree perineal laceration Hemoglobin  Date Value Range Status  05/23/2011 11.3* 12.0-15.0 (g/dL) Final     HCT  Date Value Range Status  05/23/2011 32.5* 36.0-46.0 (%) Final    Discharge Diagnoses: Term Pregnancy-delivered  Discharge Information: Date: 05/24/2011 Activity: unrestricted Diet: routine Medications: PNV, Ibuprofen and Ultram. Iron. Condition: stable Instructions: refer to practice specific booklet Discharge to: home Follow-up Information    Follow up with [facility] in 6 weeks.        MicroNor for contraception due to breast feeding.  Newborn Data: Live born female  Birth Weight: 6 lb 11.9 oz (3060 g) APGAR: 7, 9  Home with mother. Circ in the office.  Zanaria Morell V 05/24/2011, 10:38 AM

## 2011-05-24 NOTE — Progress Notes (Signed)
Referred by: CN    On: 05/23/11  For: History of anxiety   Patient Interview: Joanna Morgan Family Interview   Other:   PSYCHOSOCIAL DATA:   Lives Alone  Lives with: [name]  Admitted from Facility: Level of Care:  Primary Support (Name/Relationship): Joanna Morgan, Jr., [name]  Degree of support available:   Involved  CURRENT CONCERNS:     None noted Substance Abuse     Behavioral Health Issues: Joanna Morgan    Financial Resources     Abuse/Neglect/Domestic Violence   Cultural/Religious Issues     Post-Acute Placement    Adjustment to Illness     Knowledge/Cognitive Deficit     Other ___________________________________________________________________    SOCIAL WORK ASSESSMENT/PLAN:  Pt told Sw that she was diagnosed with anxiety disorder in high school and has taken medication (Alprazolam) regularly until pregnancy.  Pt described a difficult time, the first 3 months of being off the medication but was able to find other ways of coping.  She did experience some depression during the pregnancy but contributed symptoms to hormones.  She denies SI.  She has good support.  Sw discussed risk of PP depression and encouraged her to contact her doctor if needed.  She agrees.  [name] is a the bedside and appears supportive.   No Further Intervention Required: Joanna Morgan Psychosocial Support/Ongoing Assessment of Needs Information/Referral to Community Resources         Other                PATIENT'S/FAMILY'S RESPONSE TO PLAN OF CARE:   Pt was appreciative of Sw consult.   

## 2011-08-10 ENCOUNTER — Ambulatory Visit (INDEPENDENT_AMBULATORY_CARE_PROVIDER_SITE_OTHER): Payer: BC Managed Care – PPO

## 2011-08-12 ENCOUNTER — Ambulatory Visit (INDEPENDENT_AMBULATORY_CARE_PROVIDER_SITE_OTHER): Payer: BC Managed Care – PPO | Admitting: Family Medicine

## 2011-08-12 ENCOUNTER — Encounter: Payer: Self-pay | Admitting: Family Medicine

## 2011-08-12 VITALS — BP 98/56 | HR 60 | Temp 97.1°F | Resp 16 | Ht 67.0 in | Wt 130.0 lb

## 2011-08-12 DIAGNOSIS — B351 Tinea unguium: Secondary | ICD-10-CM

## 2011-08-12 DIAGNOSIS — L608 Other nail disorders: Secondary | ICD-10-CM

## 2011-08-12 NOTE — Progress Notes (Signed)
  Subjective:    Patient ID: CARLISLE TORGESON, female    DOB: 06/09/1986, 26 y.o.   MRN: [id number]  HPI   26 y.o Cauc female with 6-7 month hx of nail pain and discoloration. She was evaluated shortly after she noticed the problem but was not prescribed any oral medication because of her pregnancy. Now , the nail appears to be worse and is painful when wearing a shoe. She is currently nursing her 56-month old infant.    Review of Systems  Noncontributory except the pt denies trauma to the effected toenail, She denies any significant redness or swelling of the skin around the nail.     Objective:   Physical Exam  Constitutional: She appears well-developed and well-nourished.  Musculoskeletal: Normal range of motion. She exhibits no edema and no tenderness.  Skin: Skin is warm and dry.       Left great toenail has a yellowish discoloration and is slightly lifted off the nail bed proximally. Absent any signs of infection. Nail is slightly ingrown at lateral margin.          Assessment & Plan:   1. Nail fungus        Advise topical treatment because pt is nursing and we agree that topical tx is preferable so as to reduce infant exposure to any drugs. Pt will try Colorless Iodine applied to nail twice daily; she is advised that this will need to be done for several weeks because of slow nail growth.   Podiatric assessment may be needed if there is no improvement and the pain increases. She under- stands and agrees.

## 2011-08-12 NOTE — Patient Instructions (Addendum)
Nail fungus- because you are nursing, an oral medication will not be prescribed. Instead, you will try topical Colorless Iodine and apply this to the affected nail twice a day. If you continue to have pain or the nail seems to be loosening, contact us or schedule an appointment with a  Podiatrist for possible nail removal..

## 2011-08-19 ENCOUNTER — Encounter: Payer: Self-pay | Admitting: *Deleted

## 2011-08-25 ENCOUNTER — Encounter: Payer: Self-pay | Admitting: Family Medicine

## 2011-10-16 ENCOUNTER — Ambulatory Visit (INDEPENDENT_AMBULATORY_CARE_PROVIDER_SITE_OTHER): Payer: BC Managed Care – PPO | Admitting: Family Medicine

## 2011-10-16 VITALS — BP 108/68 | HR 69 | Temp 98.5°F | Resp 16 | Ht 67.5 in | Wt 126.4 lb

## 2011-10-16 DIAGNOSIS — J029 Acute pharyngitis, unspecified: Secondary | ICD-10-CM

## 2011-10-16 LAB — POCT RAPID STREP A (OFFICE): Rapid Strep A Screen: NEGATIVE

## 2011-10-16 MED ORDER — AMOXICILLIN 500 MG PO CAPS: 500.0000 mg | ORAL_CAPSULE | Freq: Three times a day (TID) | ORAL | Status: AC

## 2011-10-16 NOTE — Progress Notes (Signed)
  Subjective:    Patient ID: Joanna Morgan, female    DOB: [birth year], 26 y.o.   MRN: [id number]  HPI Joanna Morgan is a 26 y.o. female  Sore throat past 2 days - has had soreness with swallowing for 1 week. Felt lump in throat last night.  White spots this am on right side.  No fever.  No cough/congestion, runny nose. No sick contacts.  Nonsmoker.   58 month old at home - breastfeeding  Review of Systems  Constitutional: Negative for fever and chills.  HENT: Positive for sore throat. Negative for ear pain, congestion and rhinorrhea.   Respiratory: Negative for cough and shortness of breath.   Skin: Negative for rash.  Hematological: Positive for adenopathy.       Objective:   Physical Exam  Constitutional: She is oriented to person, place, and time. She appears well-developed and well-nourished.  HENT:  Head: Normocephalic and atraumatic.  Right Ear: External ear normal.  Left Ear: External ear normal.  Mouth/Throat: Uvula is midline and mucous membranes are normal. Oropharyngeal exudate and posterior oropharyngeal erythema present. No posterior oropharyngeal edema or tonsillar abscesses.    Eyes: Conjunctivae are normal. Pupils are equal, round, and reactive to light.  Neck: Normal range of motion.    Cardiovascular: Normal rate, regular rhythm, normal heart sounds and intact distal pulses.   Pulmonary/Chest: Effort normal and breath sounds normal.  Lymphadenopathy:    She has cervical adenopathy.  Neurological: She is alert and oriented to person, place, and time.  Skin: Skin is warm and dry. No rash noted.  Psychiatric: She has a normal mood and affect. Her behavior is normal.    Results for orders placed in visit on 10/16/11  POCT RAPID STREP A (OFFICE)      Component Value Range   Rapid Strep A Screen Negative  Negative          Assessment & Plan:  Joanna Morgan is a 26 y.o. female 1. Pharyngitis  POCT rapid strep A, Culture, Group A Strep   R LAD, R  peritonsilar erythema. Possible false negative r.s.  Check throat cx.  Can start amox 500tid, then if cx negative, d/c. Discussed abx with OCP's and precautions. Sx care, fluids, tylenol prn, and rtc precautions.

## 2011-10-16 NOTE — Patient Instructions (Signed)
Can start antibiotic, but if culture comes back negative, we can stop this.  Tylenol as needed for pain, cold fluids and rest. Return to the clinic or go to the nearest emergency room if any of your symptoms worsen or new symptoms occur.    Pharyngitis, Viral and Bacterial Pharyngitis is soreness (inflammation) or infection of the pharynx. It is also called a sore throat. CAUSES  Most sore throats are caused by viruses and are part of a cold. However, some sore throats are caused by strep and other bacteria. Sore throats can also be caused by post nasal drip from draining sinuses, allergies and sometimes from sleeping with an open mouth. Infectious sore throats can be spread from person to person by coughing, sneezing and sharing cups or eating utensils. TREATMENT  Sore throats that are viral usually last 3-4 days. Viral illness will get better without medications (antibiotics). Strep throat and other bacterial infections will usually begin to get better about 24-48 hours after you begin to take antibiotics. HOME CARE INSTRUCTIONS   If the caregiver feels there is a bacterial infection or if there is a positive strep test, they will prescribe an antibiotic. The full course of antibiotics must be taken. If the full course of antibiotic is not taken, you or your child may become ill again. If you or your child has strep throat and do not finish all of the medication, serious heart or kidney diseases may develop.   Drink enough water and fluids to keep your urine clear or pale yellow.   Only take over-the-counter or prescription medicines for pain, discomfort or fever as directed by your caregiver.   Get lots of rest.   Gargle with salt water ( tsp. of salt in a glass of water) as often as every 1-2 hours as you need for comfort.   Hard candies may soothe the throat if individual is not at risk for choking. Throat sprays or lozenges may also be used.  SEEK MEDICAL CARE IF:   Large, tender  lumps in the neck develop.   A rash develops.   Green, yellow-brown or bloody sputum is coughed up.   Your baby is older than 3 months with a rectal temperature of 100.5 F (38.1 C) or higher for more than 1 day.  SEEK IMMEDIATE MEDICAL CARE IF:   A stiff neck develops.   You or your child are drooling or unable to swallow liquids.   You or your child are vomiting, unable to keep medications or liquids down.   You or your child has severe pain, unrelieved with recommended medications.   You or your child are having difficulty breathing (not due to stuffy nose).   You or your child are unable to fully open your mouth.   You or your child develop redness, swelling, or severe pain anywhere on the neck.   You have a fever.   Your baby is older than 3 months with a rectal temperature of 102 F (38.9 C) or higher.   Your baby is 51 months old or younger with a rectal temperature of 100.4 F (38 C) or higher.  MAKE SURE YOU:   Understand these instructions.   Will watch your condition.   Will get help right away if you are not doing well or get worse.  Document Released: 06/27/2005 Document Revised: 06/16/2011 Document Reviewed: 09/24/2007 [facility] Patient Information 2012 [place], [place].

## 2011-10-18 LAB — CULTURE, GROUP A STREP: Organism ID, Bacteria: NORMAL

## 2012-02-27 ENCOUNTER — Encounter: Payer: Self-pay | Admitting: Obstetrics and Gynecology

## 2012-02-27 ENCOUNTER — Ambulatory Visit (INDEPENDENT_AMBULATORY_CARE_PROVIDER_SITE_OTHER): Payer: BC Managed Care – PPO | Admitting: Obstetrics and Gynecology

## 2012-02-27 VITALS — BP 98/62 | HR 68 | Ht 67.5 in | Wt 123.0 lb

## 2012-02-27 DIAGNOSIS — Z124 Encounter for screening for malignant neoplasm of cervix: Secondary | ICD-10-CM

## 2012-02-27 DIAGNOSIS — N949 Unspecified condition associated with female genital organs and menstrual cycle: Secondary | ICD-10-CM

## 2012-02-27 DIAGNOSIS — N870 Mild cervical dysplasia: Secondary | ICD-10-CM

## 2012-02-27 DIAGNOSIS — R102 Pelvic and perineal pain: Secondary | ICD-10-CM

## 2012-02-27 DIAGNOSIS — Z01419 Encounter for gynecological examination (general) (routine) without abnormal findings: Secondary | ICD-10-CM

## 2012-02-27 NOTE — Progress Notes (Addendum)
Regular Periods: yes Mammogram: no  Monthly Breast Ex.: yes Exercise: yes  Tetanus < 10 years: yes Seatbelts: yes  NI. Bladder Functn.: yes Abuse at home: no  Daily BM's: yes Stressful Work: no  Healthy Diet: yes Sigmoid-Colonoscopy: SIGMOID X 2 YEARS AGO  COLITIS  Calcium: no Medical problems this year: NO PROBLEMS   LAST PAP:10/12 LGSIL HPV/MILD DYSPLASIA/CIN1  Contraception: CONDOMS  Mammogram:  NO  PCP: DR,. [facility]  PMH: NO CHANGE  FMH: NO CHANGE  Last Bone Scan: NO  PT HAD ABNORMAL PAP 10/12; NO COLPO.  She has h/o LEEP Pt also c/o of pelvic pain that worsens with IC.  No abnormal bleeding BP 98/62  Pulse 68  Ht 5' 7.5" (1.715 m)  Wt 123 lb (55.792 kg)  BMI 18.98 kg/m2  LMP 02/04/2012 Physical Examination: General appearance - alert, well appearing, and in no distress Mental status - normal mood, behavior, speech, dress, motor activity, and thought processes Neck - supple, no significant adenopathy, thyroid exam: thyroid is normal in size without nodules or tenderness Chest - clear to auscultation, no wheezes, rales or rhonchi, symmetric air entry Heart - normal rate and regular rhythm Abdomen - soft, nontender, nondistended, no masses or organomegaly Breasts - breasts appear normal, no suspicious masses, no skin or nipple changes or axillary nodes Pelvic - normal external genitalia, vulva, vagina, cervix, uterus and adnexa Rectal - normal rectal, no masses, rectal exam not indicated Back exam - full range of motion, no tenderness, palpable spasm or pain on motion Neurological - alert, oriented, normal speech, no focal findings or movement disorder noted Musculoskeletal - no joint tenderness, deformity or swelling Extremities - no edema, redness or tenderness in the calves or thighs Skin - normal coloration and turgor, no rashes, no suspicious skin lesions noted Routine exam Pap sent yes.  Last one LGSIL no colpo done Mammogram due no pt declines contraception.  check  ucx and us@[facility]  RT 3-4 weeks

## 2012-02-27 NOTE — Patient Instructions (Signed)

## 2012-02-29 LAB — URINE CULTURE
Colony Count: NO GROWTH
Organism ID, Bacteria: NO GROWTH

## 2012-02-29 LAB — PAP IG W/ RFLX HPV ASCU

## 2012-03-17 ENCOUNTER — Ambulatory Visit: Payer: BC Managed Care – PPO

## 2012-03-17 ENCOUNTER — Ambulatory Visit (INDEPENDENT_AMBULATORY_CARE_PROVIDER_SITE_OTHER): Payer: BC Managed Care – PPO | Admitting: Emergency Medicine

## 2012-03-17 VITALS — BP 90/49 | HR 68 | Temp 98.2°F | Resp 16 | Ht 67.5 in | Wt 120.0 lb

## 2012-03-17 DIAGNOSIS — M79673 Pain in unspecified foot: Secondary | ICD-10-CM

## 2012-03-17 DIAGNOSIS — S93409A Sprain of unspecified ligament of unspecified ankle, initial encounter: Secondary | ICD-10-CM

## 2012-03-17 MED ORDER — HYDROCODONE-ACETAMINOPHEN 5-325 MG PO TABS: 1.0000 | ORAL_TABLET | ORAL | Status: AC | PRN

## 2012-03-17 NOTE — Progress Notes (Signed)
Date:  03/17/2012   Name:  Joanna Morgan   DOB:  [birth year]   MRN:  [id number] Gender: female Age: 26 y.o.  PCP:  Nelwyn Salisbury, MD    Chief Complaint: Foot Injury   History of Present Illness:  Joanna Morgan is a 26 y.o. pleasant patient who presents with the following:  Sleeping on couch and woke up to her child crying.  She got up and her foot was asleep and she suffered an inversion injury when her leg gave way and she fell to ground.  Has increased pain while walking.  No other complaints from the fall.  Patient Active Problem List  Diagnosis  . ANXIETY  . ADHD  . SORE THROAT  . ASTHMA  . ACNE VULGARIS  . RIB PAIN  . CIN I (cervical intraepithelial neoplasia I)    Past Medical History  Diagnosis Date  . Anxiety   . ADHD (attention deficit hyperactivity disorder)   . Asthma   . Depression   . GERD (gastroesophageal reflux disease)   . Abnormal Pap smear 04/26/11    LSIL HPV/MILD DYSPLASIA CIN1  . Stomach ulcer     Past Surgical History  Procedure Date  . No past surgeries   . Wisdom tooth extraction     History  Substance Use Topics  . Smoking status: Former Smoker    Quit date: 09/21/2010  . Smokeless tobacco: Never Used  . Alcohol Use: No    Family History  Problem Relation Age of Onset  . Cancer Mother     MELANOMA IN EYE  . Depression Mother   . Schizophrenia Father   . Asthma Sister   . Cancer Maternal Uncle     liver  . Cancer Maternal Grandmother     brain  . Cancer Maternal Grandfather     lung  . Diabetes Paternal Grandmother     Allergies  Allergen Reactions  . Sulfa Antibiotics Shortness Of Breath  . Codeine Phosphate Rash    Medication list has been reviewed and updated.  Current Outpatient Prescriptions on File Prior to Visit  Medication Sig Dispense Refill  . acetaminophen (TYLENOL) 500 MG tablet Take 500 mg by mouth every 6 (six) hours as needed. For pain       . norethindrone (ORTHO MICRONOR) 0.35 MG tablet  Take 1 tablet (0.35 mg total) by mouth daily.  1 Package  11  . prenatal vitamin w/FE, FA (PRENATAL 1 + 1) 27-1 MG TABS Take 1 tablet by mouth daily.          Review of Systems:  As per HPI, otherwise negative.    Physical Examination: Filed Vitals:   03/17/12 1310  BP: 90/49  Pulse: 68  Temp: 98.2 F (36.8 C)  Resp: 16   Filed Vitals:   03/17/12 1310  Height: 5' 7.5" (1.715 m)  Weight: 120 lb (54.432 kg)   Body mass index is 18.52 kg/(m^2). Ideal Body Weight: Weight in (lb) to have BMI = 25: 161.7    GEN: WDWN, NAD, Non-toxic, Alert & Oriented x 3 HEENT: Atraumatic, Normocephalic.  Ears and Nose: No external deformity. EXTR: No clubbing/cyanosis/edema NEURO: Normal gait.  PSYCH: Normally interactive. Conversant. Not depressed or anxious appearing.  Calm demeanor.  Right Ankle and Foot:  Marked tenderness and ecchymosis lateral ankle increased pain with inversion and little pain with flexion extension.  No crepitus   Assessment and Plan: Sprain ankle RICE Air cast  vicodin follow up as needed  Carmelina Dane, MD   [facility] reading (PRIMARY) by  Dr. Dareen Piano.  Foot negative.  [facility] reading (PRIMARY) by  Dr. Dareen Piano.  Ankle negative

## 2012-03-23 ENCOUNTER — Ambulatory Visit (INDEPENDENT_AMBULATORY_CARE_PROVIDER_SITE_OTHER): Payer: BC Managed Care – PPO

## 2012-03-23 ENCOUNTER — Encounter: Payer: Self-pay | Admitting: Obstetrics and Gynecology

## 2012-03-23 ENCOUNTER — Ambulatory Visit (INDEPENDENT_AMBULATORY_CARE_PROVIDER_SITE_OTHER): Payer: BC Managed Care – PPO | Admitting: Obstetrics and Gynecology

## 2012-03-23 ENCOUNTER — Other Ambulatory Visit: Payer: Self-pay | Admitting: Obstetrics and Gynecology

## 2012-03-23 VITALS — BP 98/58 | Wt 119.0 lb

## 2012-03-23 DIAGNOSIS — R102 Pelvic and perineal pain: Secondary | ICD-10-CM

## 2012-03-23 DIAGNOSIS — N949 Unspecified condition associated with female genital organs and menstrual cycle: Secondary | ICD-10-CM

## 2012-03-23 MED ORDER — NORETHINDRONE 0.35 MG PO TABS: 1.0000 | ORAL_TABLET | Freq: Every day | ORAL | Status: DC

## 2012-03-23 NOTE — Patient Instructions (Signed)
Oral Contraception Use Oral contraceptives (OCs) are medicines taken to prevent pregnancy. OCs work by preventing the ovaries from releasing eggs. The hormones in OCs also cause the cervical mucus to thicken, preventing the sperm from entering the uterus. The hormones also cause the uterine lining to become thin, not allowing a fertilized egg to attach to the inside of the uterus. OCs are highly effective when taken exactly as prescribed. However, OCs do not prevent sexually transmitted diseases (STDs). Safe sex practices, such as using condoms along with an OC, can help prevent STDs.  Before taking OCs, you may have a physical exam and Pap test. Your caregiver may also order blood tests if necessary. Your caregiver will make sure you are a good candidate for oral contraception. Discuss with your caregiver the possible side effects of the OC you may be prescribed. When starting an OC, it can take 2 to 3 months for the body to adjust to the changes in hormone levels in your body.  HOW TO TAKE ORAL CONTRACEPTIVES Your caregiver may advise you on how to start taking the first cycle of OCs. Otherwise, you can:  Start on day 1 of your menstrual period. You will not need any backup contraceptive protection with this start time.   Start on the first Sunday after your menstrual period or the day you get your prescription. In these cases, you will need to use backup contraceptive protection for the first 7-day cycle.  After you have started taking OCs:  If you forget to take 1 pill, take it as soon as you remember. Take the next pill at the regular time.   If you miss 2 or more pills, use backup birth control until your next menstrual period starts.   If you use a 28-day pack that contains inactive pills and you miss 1 of the last 7 pills (pills with no hormones), it will not matter. Throw away the rest of the non-hormone pills and start a new pill pack.  No matter which day you start the OC, you will always  start a new pack on that same day of the week. Have an extra pack of OCs and a backup contraceptive method available in case you miss some pills or lose your OC pack. HOME CARE INSTRUCTIONS   Do not smoke.   Always use a condom to protect against STDs. OCs do not protect against STDs.   Use a calendar to mark your menstrual period days.   Read the information and directions that come with your OC. Talk to your caregiver if you have questions.  SEEK MEDICAL CARE IF:   You develop nausea and vomiting.   You have abnormal vaginal discharge or bleeding.   You develop a rash.   You miss your menstrual period.   You are losing your hair.   You need treatment for mood swings or depression.   You get dizzy when taking the OC.   You develop acne from taking the OC.   You become pregnant.  SEEK IMMEDIATE MEDICAL CARE IF:   You develop chest pain.   You develop shortness of breath.   You have an uncontrolled or severe headache.   You develop numbness or slurred speech.   You develop visual problems.   You develop pain, redness, and swelling in the legs.  Document Released: 06/16/2011 Document Reviewed: 06/14/2011 [employer] Patient Information 2012 [employer]. 

## 2012-03-23 NOTE — Addendum Note (Signed)
Addended by: Rolla Plate on: 03/23/2012 04:23 PM   Modules accepted: Orders

## 2012-03-23 NOTE — Progress Notes (Signed)
Pt is here for u/s f/u.  Pt with pelvic pain and dysparuneia.  She has had it for several months.  It feels like constnat cramping.  Nothing makes it better or worse.  She takes no pain meds BP 98/58  Wt 119 lb (53.978 kg)  LMP 03/16/2012 [place] 8.70 cm by 5.21 cm with normal adnexa B Pelvic pain All treatments reviewed with the pt She chose micronor bc she is BF Rx sent.  RT 6 months pts last pap 8/13 WNL Pt has appt with GI as well

## 2012-03-23 NOTE — Addendum Note (Signed)
Addended by: Rolla Plate on: 03/23/2012 03:40 PM   Modules accepted: Orders

## 2012-06-21 ENCOUNTER — Telehealth: Payer: Self-pay | Admitting: Obstetrics and Gynecology

## 2012-06-21 NOTE — Telephone Encounter (Signed)
Returned pt's call. LM to return call.   

## 2012-06-21 NOTE — Telephone Encounter (Signed)
TC from pt. States started Micronor 04/2012. Has not had menses. No missed or late pills. Explained is possible may not have menses.  Advised UPT and continue med if negative. Pt verbalizes comprehension.

## 2012-08-13 ENCOUNTER — Encounter: Payer: Self-pay | Admitting: Family Medicine

## 2012-08-13 ENCOUNTER — Ambulatory Visit (INDEPENDENT_AMBULATORY_CARE_PROVIDER_SITE_OTHER): Payer: BC Managed Care – PPO | Admitting: Family Medicine

## 2012-08-13 VITALS — BP 96/58 | HR 124 | Temp 98.2°F | Wt 114.0 lb

## 2012-08-13 DIAGNOSIS — J039 Acute tonsillitis, unspecified: Secondary | ICD-10-CM

## 2012-08-13 MED ORDER — CEPHALEXIN 500 MG PO CAPS: 500.0000 mg | ORAL_CAPSULE | Freq: Three times a day (TID) | ORAL | Status: DC

## 2012-08-13 NOTE — Progress Notes (Signed)
  Subjective:    Patient ID: Joanna Morgan, female    DOB: 18-Mar-1986, 27 y.o.   MRN: [id number]  HPI Here for 3 days of a bad ST on the left side of the throat. Prior the that she had a dry cough and some PND for one week. These symptoms resolved but the ST is getting worse. No trouble swallowing. No fever.    Review of Systems  Constitutional: Negative.   HENT: Positive for sore throat. Negative for ear pain, trouble swallowing, neck pain, neck stiffness and voice change.   Eyes: Negative.   Respiratory: Negative.        Objective:   Physical Exam  Constitutional: She appears well-developed and well-nourished.  HENT:  Right Ear: External ear normal.  Left Ear: External ear normal.  Nose: Nose normal.  Mouth/Throat: No oropharyngeal exudate.       The left peritonsillar area is bright red with no exudate   Eyes: Conjunctivae normal are normal.  Neck: Neck supple. No thyromegaly present.       Tender enlarged AC nodes on the left           Assessment & Plan:  Recheck prn. She may use Ibuprofen prn

## 2012-09-11 ENCOUNTER — Ambulatory Visit (INDEPENDENT_AMBULATORY_CARE_PROVIDER_SITE_OTHER): Payer: BC Managed Care – PPO | Admitting: Family Medicine

## 2012-09-11 ENCOUNTER — Encounter: Payer: Self-pay | Admitting: Family Medicine

## 2012-09-11 ENCOUNTER — Telehealth: Payer: Self-pay | Admitting: Family Medicine

## 2012-09-11 VITALS — BP 100/58 | HR 139 | Temp 100.0°F | Wt 114.0 lb

## 2012-09-11 DIAGNOSIS — J029 Acute pharyngitis, unspecified: Secondary | ICD-10-CM

## 2012-09-11 MED ORDER — AMOXICILLIN-POT CLAVULANATE 875-125 MG PO TABS: 1.0000 | ORAL_TABLET | Freq: Two times a day (BID) | ORAL | Status: DC

## 2012-09-11 NOTE — Progress Notes (Signed)
  Subjective:    Patient ID: Joanna Morgan, female    DOB: March 09, 1986, 27 y.o.   MRN: [id number]  HPI Here for one week of body aches, fever to 101 degrees, coughing up yellow sputum, and now with a bad ST. No NVD. Drinking fluids.    Review of Systems  Constitutional: Positive for fever and chills.  HENT: Positive for sore throat. Negative for ear pain, congestion, postnasal drip and sinus pressure.   Eyes: Negative.   Respiratory: Positive for cough.        Objective:   Physical Exam  Constitutional: She appears well-developed and well-nourished.  HENT:  Right Ear: External ear normal.  Left Ear: External ear normal.  Nose: Nose normal.  Posterior OP red without exudate   Eyes: Conjunctivae are normal.  Neck:  Tender bilateral AC nodes   Pulmonary/Chest: Effort normal and breath sounds normal.          Assessment & Plan:  Pharyngitis. Use Advil prn fever. Out of work today and tomorrow.

## 2012-09-11 NOTE — Telephone Encounter (Signed)
Called back after office visit 09/11/12 to ask if antibiotic ordered today is safe to use while breastfeeding.  57 month old infant nurses at least 2X/day.  Per [employer], advised " Amoxicillin and clavulanic acid is acceptable to use during breastfeeding. Limited information indicates that serious reactions in infants are very uncommon during the use of amoxicillin-clavulanic acid during nursing, with restlessness, diarrhea and rash occurring occasionally. If amoxicillin-clavulanic acid is required by the mother, it is not a reason to discontinue breastfeeding. Monitor the infant for these reactions during nursing."  Asked if the use of an antibiotic might reduce the effectiveness of birth control pill.  Advised yes it can; Instructed to use an alternative source of contraception as back up until begins new cycle of oral contraception.

## 2012-10-11 ENCOUNTER — Ambulatory Visit (INDEPENDENT_AMBULATORY_CARE_PROVIDER_SITE_OTHER): Payer: BC Managed Care – PPO | Admitting: Family Medicine

## 2012-10-11 VITALS — BP 102/58 | HR 102 | Temp 98.1°F | Resp 16 | Ht 67.0 in | Wt 115.0 lb

## 2012-10-11 DIAGNOSIS — J019 Acute sinusitis, unspecified: Secondary | ICD-10-CM

## 2012-10-11 DIAGNOSIS — J209 Acute bronchitis, unspecified: Secondary | ICD-10-CM

## 2012-10-11 MED ORDER — FLUTICASONE PROPIONATE 50 MCG/ACT NA SUSP: 2.0000 | Freq: Every day | NASAL | Status: DC

## 2012-10-11 MED ORDER — HYDROCODONE-HOMATROPINE 5-1.5 MG/5ML PO SYRP: 5.0000 mL | ORAL_SOLUTION | Freq: Three times a day (TID) | ORAL | Status: DC | PRN

## 2012-10-11 MED ORDER — ALBUTEROL SULFATE HFA 108 (90 BASE) MCG/ACT IN AERS: 2.0000 | INHALATION_SPRAY | Freq: Four times a day (QID) | RESPIRATORY_TRACT | Status: DC | PRN

## 2012-10-11 NOTE — Patient Instructions (Addendum)
Bronchitis  Bronchitis is the body's way of reacting to injury and/or infection (inflammation) of the bronchi. Bronchi are the air tubes that extend from the windpipe into the lungs. If the inflammation becomes severe, it may cause shortness of breath.  CAUSES   Inflammation may be caused by:   A virus.   Germs (bacteria).   Dust.   Allergens.   Pollutants and many other irritants.  The cells lining the bronchial tree are covered with tiny hairs (cilia). These constantly beat upward, away from the lungs, toward the mouth. This keeps the lungs free of pollutants. When these cells become too irritated and are unable to do their job, mucus begins to develop. This causes the characteristic cough of bronchitis. The cough clears the lungs when the cilia are unable to do their job. Without either of these protective mechanisms, the mucus would settle in the lungs. Then you would develop pneumonia.  Smoking is a common cause of bronchitis and can contribute to pneumonia. Stopping this habit is the single most important thing you can do to help yourself.  TREATMENT    Your caregiver may prescribe an antibiotic if the cough is caused by bacteria. Also, medicines that open up your airways make it easier to breathe. Your caregiver may also recommend or prescribe an expectorant. It will loosen the mucus to be coughed up. Only take over-the-counter or prescription medicines for pain, discomfort, or fever as directed by your caregiver.   Removing whatever causes the problem (smoking, for example) is critical to preventing the problem from getting worse.   Cough suppressants may be prescribed for relief of cough symptoms.   Inhaled medicines may be prescribed to help with symptoms now and to help prevent problems from returning.   For those with recurrent (chronic) bronchitis, there may be a need for steroid medicines.  SEEK IMMEDIATE MEDICAL CARE IF:    During treatment, you develop more pus-like mucus (purulent  sputum).   You have a fever.   Your baby is older than 3 months with a rectal temperature of 102 F (38.9 C) or higher.   Your baby is 3 months old or younger with a rectal temperature of 100.4 F (38 C) or higher.   You become progressively more ill.   You have increased difficulty breathing, wheezing, or shortness of breath.  It is necessary to seek immediate medical care if you are elderly or sick from any other disease.  MAKE SURE YOU:    Understand these instructions.   Will watch your condition.   Will get help right away if you are not doing well or get worse.  Document Released: 06/27/2005 Document Revised: 09/19/2011 Document Reviewed: 05/06/2008  [employer] Patient Information 2013 [employer].    Sinusitis  Sinusitis is redness, soreness, and swelling (inflammation) of the paranasal sinuses. Paranasal sinuses are air pockets within the bones of your face (beneath the eyes, the middle of the forehead, or above the eyes). In healthy paranasal sinuses, mucus is able to drain out, and air is able to circulate through them by way of your nose. However, when your paranasal sinuses are inflamed, mucus and air can become trapped. This can allow bacteria and other germs to grow and cause infection.  Sinusitis can develop quickly and last only a short time (acute) or continue over a long period (chronic). Sinusitis that lasts for more than 12 weeks is considered chronic.   CAUSES   Causes of sinusitis include:   Allergies.     Structural abnormalities, such as displacement of the cartilage that separates your nostrils (deviated septum), which can decrease the air flow through your nose and sinuses and affect sinus drainage.   Functional abnormalities, such as when the small hairs (cilia) that line your sinuses and help remove mucus do not work properly or are not present.  SYMPTOMS   Symptoms of acute and chronic sinusitis are the same. The primary symptoms are pain and pressure around the affected  sinuses. Other symptoms include:   Upper toothache.   Earache.   Headache.   Bad breath.   Decreased sense of smell and taste.   A cough, which worsens when you are lying flat.   Fatigue.   Fever.   Thick drainage from your nose, which often is green and may contain pus (purulent).   Swelling and warmth over the affected sinuses.  DIAGNOSIS   Your caregiver will perform a physical exam. During the exam, your caregiver may:   Look in your nose for signs of abnormal growths in your nostrils (nasal polyps).   Tap over the affected sinus to check for signs of infection.   View the inside of your sinuses (endoscopy) with a special imaging device with a light attached (endoscope), which is inserted into your sinuses.  If your caregiver suspects that you have chronic sinusitis, one or more of the following tests may be recommended:   Allergy tests.   Nasal culture A sample of mucus is taken from your nose and sent to a lab and screened for bacteria.   Nasal cytology A sample of mucus is taken from your nose and examined by your caregiver to determine if your sinusitis is related to an allergy.  TREATMENT   Most cases of acute sinusitis are related to a viral infection and will resolve on their own within 10 days. Sometimes medicines are prescribed to help relieve symptoms (pain medicine, decongestants, nasal steroid sprays, or saline sprays).   However, for sinusitis related to a bacterial infection, your caregiver will prescribe antibiotic medicines. These are medicines that will help kill the bacteria causing the infection.   Rarely, sinusitis is caused by a fungal infection. In theses cases, your caregiver will prescribe antifungal medicine.  For some cases of chronic sinusitis, surgery is needed. Generally, these are cases in which sinusitis recurs more than 3 times per year, despite other treatments.  HOME CARE INSTRUCTIONS    Drink plenty of water. Water helps thin the mucus so your sinuses can drain  more easily.   Use a humidifier.   Inhale steam 3 to 4 times a day (for example, sit in the bathroom with the shower running).   Apply a warm, moist washcloth to your face 3 to 4 times a day, or as directed by your caregiver.   Use saline nasal sprays to help moisten and clean your sinuses.   Take over-the-counter or prescription medicines for pain, discomfort, or fever only as directed by your caregiver.  SEEK IMMEDIATE MEDICAL CARE IF:   You have increasing pain or severe headaches.   You have nausea, vomiting, or drowsiness.   You have swelling around your face.   You have vision problems.   You have a stiff neck.   You have difficulty breathing.  MAKE SURE YOU:    Understand these instructions.   Will watch your condition.   Will get help right away if you are not doing well or get worse.  Document Released: 06/27/2005 Document Revised: 09/19/2011 Document

## 2012-10-11 NOTE — Progress Notes (Signed)
Patient ID: Joanna Morgan MRN: [id number], DOB: March 28, 1986, 26 y.o. Date of Encounter: 10/11/2012, 8:40 PM  Primary Physician: Nelwyn Salisbury, MD  Chief Complaint:  Chief Complaint  Patient presents with  . Cough    congestion since Friday  . Sore Throat  . Headache    tension  . Fever    HPI: 27 y.o. year old female presents with 7 day history of nasal congestion, post nasal drip, sore throat, sinus pressure, and cough. Afebrile. No chills. Nasal congestion thick and green/yellow. Sinus pressure is the worst symptom. Cough is productive secondary to post nasal drip and not associated with time of day. Ears feel full, leading to sensation of muffled hearing. Has tried OTC cold preps without success. No GI complaints.   Son is also sick (20 months old)  No recent antibiotics, recent travels, or sick contacts   No leg trauma, sedentary periods, h/o cancer, or tobacco use.  Past Medical History  Diagnosis Date  . Anxiety   . ADHD (attention deficit hyperactivity disorder)   . Asthma   . Depression   . GERD (gastroesophageal reflux disease)   . Abnormal Pap smear 04/26/11    LSIL HPV/MILD DYSPLASIA CIN1  . Stomach ulcer      Home Meds: Prior to Admission medications   Medication Sig Start Date End Date Taking? Authorizing Provider  norethindrone (ORTHO MICRONOR) 0.35 MG tablet Take 1 tablet (0.35 mg total) by mouth daily. 03/23/12 03/23/13 Yes Naima A Dillard, MD  acetaminophen (TYLENOL) 500 MG tablet Take 500 mg by mouth every 6 (six) hours as needed. For pain     Historical Provider, MD  Multiple Vitamins-Minerals (MULTIVITAMIN WITH MINERALS) tablet Take 1 tablet by mouth daily.    Historical Provider, MD    Allergies:  Allergies  Allergen Reactions  . Sulfa Antibiotics Shortness Of Breath  . Codeine Phosphate Rash    History   Social History  . Marital Status: Single    Spouse Name: N/A    Number of Children: N/A  . Years of Education: N/A   Occupational  History  . Not on file.   Social History Main Topics  . Smoking status: Former Smoker    Quit date: 09/21/2010  . Smokeless tobacco: Never Used  . Alcohol Use: No  . Drug Use: No  . Sexually Active: Yes    Birth Control/ Protection: Condom, Pill   Other Topics Concern  . Not on file   Social History Narrative  . No narrative on file     Review of Systems: Constitutional: negative for  weight changes Cardiovascular: negative for chest pain or palpitations Respiratory: negative for hemoptysis, wheezing, or shortness of breath Abdominal: negative for abdominal pain, nausea, vomiting or diarrhea Dermatological: negative for rash Neurologic: negative for headache   Physical Exam: Blood pressure 102/58, pulse 102, temperature 98.1 F (36.7 C), temperature source Oral, resp. rate 16, height 5\' 7"  (1.702 m), weight 115 lb (52.164 kg), SpO2 97.00%., Body mass index is 18.01 kg/(m^2). General: Well developed, well nourished, in no acute distress. Head: Normocephalic, atraumatic, eyes without discharge, sclera non-icteric, nares are congested. Bilateral auditory canals clear, TM's are without perforation, pearly grey with reflective cone of light bilaterally. Serous effusion bilaterally behind TM's. Maxillary sinus TTP. Oral cavity moist, dentition normal. Posterior pharynx with post nasal drip and mild erythema. No peritonsillar abscess or tonsillar exudate. Neck: Supple. No thyromegaly. Full ROM. No lymphadenopathy. Lungs: Clear bilaterally to auscultation without wheezes, rales, or rhonchi.  Breathing is unlabored.  Heart: RRR with S1 S2. No murmurs, rubs, or gallops appreciated. Msk:  Strength and tone normal for age. Extremities: No clubbing or cyanosis. No edema. Neuro: Alert and oriented X 3. Moves all extremities spontaneously. CNII-XII grossly in tact. Psych:  Responds to questions appropriately with a normal affect.   Labs:   ASSESSMENT AND PLAN:  27 y.o. year old female  with sinusitis and bronchitis  -  -Tylenol/Motrin prn -Rest/fluids -RTC precautions -RTC 3-5 days if no improvement  Signed, Elvina Sidle, MD 10/11/2012 8:40 PM

## 2013-12-18 ENCOUNTER — Ambulatory Visit: Payer: Self-pay | Admitting: Gastroenterology

## 2014-01-17 ENCOUNTER — Telehealth: Payer: Self-pay | Admitting: Family Medicine

## 2014-01-17 NOTE — Telephone Encounter (Signed)
Received 3 pages from [facility], sent to Dr. Sarajane Jews. 01/17/14/ss.

## 2014-07-11 NOTE — L&D Delivery Note (Signed)
Delivery Note Pushed well but there was slow descent with asynclitism.  Turned to push on side and delivery occurred.   At 6:12 AM a viable and healthy female was delivered via  (Presentation: LOA  ).  APGAR: , ; weight  .   Placenta status: spontaneous and grossly intact with 3VCord:  with the following complications: Nuchal cord x 2 tight, delivered through  Anesthesia:  epidural Episiotomy:  none Lacerations:  1st degree midline perineal Suture Repair: 3.0 Monocryl Est. Blood Loss (mL): 100   Mom to postpartum.  Baby to Couplet care / Skin to Skin.  [facility] 05/12/2015, 6:38 AM

## 2014-08-13 ENCOUNTER — Ambulatory Visit: Payer: BC Managed Care – PPO | Admitting: Family Medicine

## 2014-09-22 LAB — OB RESULTS CONSOLE GC/CHLAMYDIA
Chlamydia: NEGATIVE
Gonorrhea: NEGATIVE

## 2014-09-22 LAB — OB RESULTS CONSOLE HEPATITIS B SURFACE ANTIGEN: Hepatitis B Surface Ag: NEGATIVE

## 2014-09-22 LAB — OB RESULTS CONSOLE RUBELLA ANTIBODY, IGM: Rubella: IMMUNE

## 2014-09-22 LAB — OB RESULTS CONSOLE ABO/RH: RH Type: POSITIVE

## 2014-09-22 LAB — OB RESULTS CONSOLE HGB/HCT, BLOOD
HCT: 38 %
Hemoglobin: 12.4 g/dL

## 2014-09-22 LAB — OB RESULTS CONSOLE HIV ANTIBODY (ROUTINE TESTING): HIV: NONREACTIVE

## 2014-09-22 LAB — OB RESULTS CONSOLE ANTIBODY SCREEN: Antibody Screen: NEGATIVE

## 2014-09-22 LAB — OB RESULTS CONSOLE PLATELET COUNT: Platelets: 256 10*3/uL

## 2014-10-21 ENCOUNTER — Encounter (INDEPENDENT_AMBULATORY_CARE_PROVIDER_SITE_OTHER): Payer: Self-pay

## 2014-10-21 ENCOUNTER — Encounter: Payer: Self-pay | Admitting: Internal Medicine

## 2014-10-21 ENCOUNTER — Ambulatory Visit (INDEPENDENT_AMBULATORY_CARE_PROVIDER_SITE_OTHER): Payer: 59 | Admitting: Internal Medicine

## 2014-10-21 VITALS — BP 104/58 | HR 79 | Temp 98.4°F | Ht 67.0 in | Wt 124.0 lb

## 2014-10-21 DIAGNOSIS — F329 Major depressive disorder, single episode, unspecified: Secondary | ICD-10-CM

## 2014-10-21 DIAGNOSIS — F909 Attention-deficit hyperactivity disorder, unspecified type: Secondary | ICD-10-CM | POA: Diagnosis not present

## 2014-10-21 DIAGNOSIS — R062 Wheezing: Secondary | ICD-10-CM | POA: Diagnosis not present

## 2014-10-21 DIAGNOSIS — F32A Depression, unspecified: Secondary | ICD-10-CM

## 2014-10-21 DIAGNOSIS — J4520 Mild intermittent asthma, uncomplicated: Secondary | ICD-10-CM | POA: Diagnosis not present

## 2014-10-21 DIAGNOSIS — F418 Other specified anxiety disorders: Secondary | ICD-10-CM

## 2014-10-21 DIAGNOSIS — F419 Anxiety disorder, unspecified: Secondary | ICD-10-CM

## 2014-10-21 MED ORDER — ALBUTEROL SULFATE HFA 108 (90 BASE) MCG/ACT IN AERS: 2.0000 | INHALATION_SPRAY | Freq: Four times a day (QID) | RESPIRATORY_TRACT | Status: DC | PRN

## 2014-10-21 NOTE — Assessment & Plan Note (Signed)
Chronic but stable off meds Will continue to monitor

## 2014-10-21 NOTE — Assessment & Plan Note (Signed)
This has not been officially diagnosed Will pursue workup after her pregnancy

## 2014-10-21 NOTE — Progress Notes (Signed)
Pre visit review using our clinic review tool, if applicable. No additional management support is needed unless otherwise documented below in the visit note. 

## 2014-10-21 NOTE — Patient Instructions (Signed)
Bronchospasm A bronchospasm is when the tubes that carry air in and out of your lungs (airways) spasm or tighten. During a bronchospasm it is hard to breathe. This is because the airways get smaller. A bronchospasm can be triggered by:  Allergies. These may be to animals, pollen, food, or mold.  Infection. This is a common cause of bronchospasm.  Exercise.  Irritants. These include pollution, cigarette smoke, strong odors, aerosol sprays, and paint fumes.  Weather changes.  Stress.  Being emotional. HOME CARE   Always have a plan for getting help. Know when to call your doctor and local emergency services (911 in the [place] Know where you can get emergency care.  Only take medicines as told by your doctor.  If you were prescribed an inhaler or nebulizer machine, ask your doctor how to use it correctly. Always use a spacer with your inhaler if you were given one.  Stay calm during an attack. Try to relax and breathe more slowly.  Control your home environment:  Change your heating and air conditioning filter at least once a month.  Limit your use of fireplaces and wood stoves.  Do not  smoke. Do not  allow smoking in your home.  Avoid perfumes and fragrances.  Get rid of pests (such as roaches and mice) and their droppings.  Throw away plants if you see mold on them.  Keep your house clean and dust free.  Replace carpet with wood, tile, or vinyl flooring. Carpet can trap dander and dust.  Use allergy-proof pillows, mattress covers, and box spring covers.  Wash bed sheets and blankets every week in hot water. Dry them in a dryer.  Use blankets that are made of polyester or cotton.  Wash hands frequently. GET HELP IF:  You have muscle aches.  You have chest pain.  The thick spit you spit or cough up (sputum) changes from clear or white to yellow, green, gray, or bloody.  The thick spit you spit or cough up gets thicker.  There are problems that may be related  to the medicine you are given such as:  A rash.  Itching.  Swelling.  Trouble breathing. GET HELP RIGHT AWAY IF:  You feel you cannot breathe or catch your breath.  You cannot stop coughing.  Your treatment is not helping you breathe better.  You have very bad chest pain. MAKE SURE YOU:   Understand these instructions.  Will watch your condition.  Will get help right away if you are not doing well or get worse. Document Released: 04/24/2009 Document Revised: 07/02/2013 Document Reviewed: 12/18/2012 Exit[employer] Patient Information 2015 [employer]. This information is not intended to replace advice given to you by your health care provider. Make sure you discuss any questions you have with your health care provider.  

## 2014-10-21 NOTE — Progress Notes (Signed)
HPI  Pt presents to the clinic today to establish care and for management of the conditions listed below. She is transferring care from Dr. Sarajane Jews. She reports that she is [redacted] weeks pregnant. She is following with [facility].  Flu: 2015 Tetanus: 2012 LMP: 08/11/14 Pap Smear: 2013, scheduled to have one tomorrow Dentist: as needed  Anxiety and Depression: Started in high school, college. She was treated for Xanax which she took up until 3 years ago. She reports she occasionally has issues with anxiety but can handle it better than she used to.  ADHD: Started in high school. She took Adderall all throughout college but stopped when she got pregnant 3 years ago. She never restarted Adderall after her pregnancy.  She also reports a cough. This started 10 days ago. She has had some associated chest congestion. The cough is nonproductive and worse at night. She does reports some shortness of breath. She denies fever, chills or body aches. She denies other URI symptoms.She has not taken anything OTC. She does have a history of asthma. She has not had sick contacts that she is aware of.   Past Medical History  Diagnosis Date  . Anxiety   . ADHD (attention deficit hyperactivity disorder)   . Asthma   . Depression   . Abnormal Pap smear 04/26/11    LSIL HPV/MILD DYSPLASIA CIN1  . Stomach ulcer     Current Outpatient Prescriptions  Medication Sig Dispense Refill  . Multiple Vitamin (MULTIVITAMIN) tablet Take 1 tablet by mouth daily.     No current facility-administered medications for this visit.    Allergies  Allergen Reactions  . Sulfa Antibiotics Shortness Of Breath  . Codeine Phosphate Rash    Family History  Problem Relation Age of Onset  . Cancer Mother     MELANOMA IN EYE  . Depression Mother   . Schizophrenia Father   . Asthma Sister   . Cancer Maternal Uncle     liver  . Cancer Maternal Grandmother     brain  . Cancer Maternal Grandfather     lung  . Diabetes  Paternal Grandmother     History   Social History  . Marital Status: Married    Spouse Name: N/A  . Number of Children: N/A  . Years of Education: N/A   Occupational History  . Not on file.   Social History Main Topics  . Smoking status: Former Smoker    Quit date: 09/21/2010  . Smokeless tobacco: Never Used  . Alcohol Use: No  . Drug Use: No  . Sexual Activity: Yes    Birth Control/ Protection: Condom, Pill   Other Topics Concern  . Not on file   Social History Narrative    ROS:  Constitutional: Denies fever, malaise, fatigue, headache or abrupt weight changes.  HEENT: Denies eye pain, eye redness, ear pain, ringing in the ears, wax buildup, runny nose, nasal congestion, bloody nose, or sore throat. Respiratory: Pt reports cough. Denies difficulty breathing, shortness of breath, or sputum production.   Cardiovascular: Denies chest pain, chest tightness, palpitations or swelling in the hands or feet.  Gastrointestinal: Denies abdominal pain, bloating, constipation, diarrhea or blood in the stool.  GU: Denies frequency, urgency, pain with urination, blood in urine, odor or discharge. Skin: Denies redness, rashes, lesions or ulcercations.  Neurological: Denies dizziness, difficulty with memory, difficulty with speech or problems with balance and coordination.  Psych: Pt reports history of anxiety and depression. Denies SI.  No  other specific complaints in a complete review of systems (except as listed in HPI above).  PE:  BP 104/58 mmHg  Pulse 79  Temp(Src) 98.4 F (36.9 C) (Oral)  Ht 5\' 7"  (1.702 m)  Wt 124 lb (56.246 kg)  BMI 19.42 kg/m2  SpO2 99% Wt Readings from Last 3 Encounters:  10/21/14 124 lb (56.246 kg)  10/11/12 115 lb (52.164 kg)  09/11/12 114 lb (51.71 kg)    General: Appears her stated age, well developed, well nourished in NAD. Skin: Warm, dry and intact HEENT: Head: normal shape and size; Eyes: sclera white, no icterus, conjunctiva pink;  Ears: Tm's gray and intact, normal light reflex; Nose: mucosa pink and moist, septum midline; Throat/Mouth: Teeth present, mucosa pink and moist, no lesions or ulcerations noted.  Neck: No adenopathy noted.  Cardiovascular: Normal rate and rhythm. S1,S2 noted.  No murmur, rubs or gallops noted.  Pulmonary/Chest: Normal effort and bilateral expiratory wheezing noted. No respiratory distress. No rales or ronchi noted.  Neurological: Alert and oriented.  Psychiatric: Mood and affect normal. Behavior is normal. Judgment and thought content normal.     BMET    Component Value Date/Time   NA 143 04/18/2007 1137   K 5.0 04/18/2007 1137   CL 107 04/18/2007 1137   CO2 31 04/18/2007 1137   GLUCOSE 74 04/18/2007 1137   BUN 2* 04/18/2007 1137   CREATININE 0.7 04/18/2007 1137   CALCIUM 9.5 04/18/2007 1137   GFRNONAA 115 04/18/2007 1137   GFRAA 139 04/18/2007 1137    Lipid Panel  No results found for: CHOL, TRIG, HDL, CHOLHDL, VLDL, LDLCALC  CBC    Component Value Date/Time   WBC 10.5 05/23/2011 0535   RBC 3.52* 05/23/2011 0535   HGB 11.3* 05/23/2011 0535   HCT 32.5* 05/23/2011 0535   PLT 126* 05/23/2011 0535   MCV 92.3 05/23/2011 0535   MCH 32.1 05/23/2011 0535   MCHC 34.8 05/23/2011 0535   RDW 12.6 05/23/2011 0535   MONOABS 0.4 04/18/2007 1137   EOSABS 0.1 04/18/2007 1137   BASOSABS 0.0 04/18/2007 1137    Hgb A1C No results found for: HGBA1C   Assessment and Plan:  Wheezing:  Likely r/t her asthma eRx for Albuterol inhaler Tylenol if you develop fever or body aches If worsening cough or SOB, please call me ASAP or go to nearest UC/ER  RTC in 1 year or sooner if needed

## 2014-12-10 ENCOUNTER — Encounter: Payer: Self-pay | Admitting: Obstetrics & Gynecology

## 2014-12-10 ENCOUNTER — Ambulatory Visit (INDEPENDENT_AMBULATORY_CARE_PROVIDER_SITE_OTHER): Payer: 59 | Admitting: Obstetrics & Gynecology

## 2014-12-10 VITALS — BP 97/65 | HR 106 | Wt 127.0 lb

## 2014-12-10 DIAGNOSIS — Z3482 Encounter for supervision of other normal pregnancy, second trimester: Secondary | ICD-10-CM

## 2014-12-10 DIAGNOSIS — Z3490 Encounter for supervision of normal pregnancy, unspecified, unspecified trimester: Secondary | ICD-10-CM | POA: Insufficient documentation

## 2014-12-10 NOTE — Progress Notes (Signed)
Bedside ultrasound today femur length measures [redacted]w[redacted]d fetus with heartbeat.

## 2014-12-10 NOTE — Progress Notes (Signed)
This MW 29 yo P67 (64 yo son Alvan Dame) is transferring her prenatal care here from [place]. She had her routine PN labs there as well as a reportedly normal pap smear. She will get a quad screen here today. I will schedule an anatomy u/s for 19 weeks.

## 2014-12-12 ENCOUNTER — Encounter: Payer: Self-pay | Admitting: Primary Care

## 2014-12-12 ENCOUNTER — Telehealth: Payer: Self-pay | Admitting: Internal Medicine

## 2014-12-12 ENCOUNTER — Ambulatory Visit (INDEPENDENT_AMBULATORY_CARE_PROVIDER_SITE_OTHER): Payer: 59 | Admitting: Primary Care

## 2014-12-12 VITALS — BP 100/60 | HR 85 | Temp 98.2°F | Ht 67.0 in | Wt 128.0 lb

## 2014-12-12 DIAGNOSIS — M6248 Contracture of muscle, other site: Secondary | ICD-10-CM

## 2014-12-12 DIAGNOSIS — M62838 Other muscle spasm: Secondary | ICD-10-CM

## 2014-12-12 LAB — AFP, QUAD SCREEN
AFP: 42.2 ng/mL
Age Alone: 1:934 {titer}
Curr Gest Age: 17.2 wks.days
Down Syndrome Scr Risk Est: 1:4960 {titer}
HCG, Total: 30.61 IU/mL
INH: 261.3 pg/mL
Interpretation-AFP: NEGATIVE
MoM for AFP: 0.97
MoM for INH: 1.34
MoM for hCG: 0.92
Open Spina bifida: NEGATIVE
Osb Risk: 1:15000 {titer}
Tri 18 Scr Risk Est: NEGATIVE
Trisomy 18 (Edward) Syndrome Interp.: 1:47600 {titer}
uE3 Mom: 0.91
uE3 Value: 0.99 ng/mL

## 2014-12-12 NOTE — Telephone Encounter (Signed)
Pt has appt 12/12/14 at 2 pm with Allie Bossier NP.

## 2014-12-12 NOTE — Telephone Encounter (Signed)
[place] Patient Name: Joanna Morgan DOB: 02-Sep-1985 Initial Comment Caller states she pulled muscle in neck and shoulder days ago, still hurts to move neck, [redacted] wks pregnant, only taking tylenol Nurse Assessment Nurse: Markus Daft, RN, [place] Date/Time (Eastern Time): 12/12/2014 11:02:11 AM Confirm and document reason for call. If symptomatic, describe symptoms. 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 Still hurts to move neck. Unable to touch her chin to chest. She had to call out of work. [redacted] wks pregnant, only taking Tylenol Has the patient traveled out of the country within the last 30 days? ---Not Applicable Does the patient require triage? ---Yes Related visit to physician within the last 2 weeks? ---No Does the PT have any chronic conditions? (i.e. diabetes, asthma, etc.) ---No Did the patient indicate they were pregnant? ---Yes How many weeks gestation? ---17 wks Have you felt decreased fetal movement? ---Early Pregnancy - No Fetal Movement Felt Yet Guidelines Guideline Title Affirmed Question Affirmed Notes Neck Pain or Stiffness [1] MODERATE neck pain (e.g., interferes with normal activities AND [2] present > 3 days Final Disposition User See PCP When Office is Open (within 3 days) Markus Daft, Therapist, sports, [place] Comments Appt made with Alma Friendly, NP today at 2 pm. Note: Pt has seen her OB doctor this week, and everything checked out ok with the baby.

## 2014-12-12 NOTE — Patient Instructions (Signed)
Continue tylenol and heating pads for pain. The recommendation for [facility] is not to use during the last 3 months of pregnancy. Rest your neck and shoulder. Call if you develop fevers, confusion and worsening neck pain. It was nice to meet you!

## 2014-12-12 NOTE — Progress Notes (Signed)
Subjective:    Patient ID: Joanna Morgan, female    DOB: October 03, 1985, 29 y.o.   MRN: [id number]  HPI  Joanna Morgan is a 29 year old female who presents today with a chief complaint of shoulder and neck pain. Her neck pain is located to the posterior right side and her shoulder pain is located to the left side. Her pain began Tuesday and has progressively worsened. She reports sleeping in an awkward angle on the couch Monday night with her arms in an abnormal position. 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 Yesterday after work she felt worse and today she feels about the same. She's taken tylenol and applied a heating pad with some relief. She is [redacted] weeks pregnant. Denies recent injury, fevers, confusion.  Review of Systems  Constitutional: Negative for fever and chills.  Respiratory: Negative for shortness of breath.   Cardiovascular: Negative for chest pain.  Musculoskeletal: Positive for neck pain and neck stiffness.  Neurological: Positive for headaches. Negative for dizziness.  Psychiatric/Behavioral: Negative for confusion.       Past Medical History  Diagnosis Date  . Anxiety   . ADHD (attention deficit hyperactivity disorder)   . Depression   . Abnormal Pap smear 04/26/11    LSIL HPV/MILD DYSPLASIA CIN1  . Stomach ulcer     History   Social History  . Marital Status: Married    Spouse Name: N/A  . Number of Children: N/A  . Years of Education: N/A   Occupational History  . Not on file.   Social History Main Topics  . Smoking status: Former Smoker    Quit date: 09/21/2010  . Smokeless tobacco: Never Used  . Alcohol Use: No  . Drug Use: No  . Sexual Activity: Yes    Birth Control/ Protection: None   Other Topics Concern  . Not on file   Social History Narrative    Past Surgical History  Procedure Laterality Date  . No past surgeries    . Wisdom tooth extraction      Family History  Problem Relation Age of Onset  . Cancer Mother    MELANOMA IN EYE  . Depression Mother   . Schizophrenia Father   . Asthma Sister   . Cancer Maternal Uncle     liver  . Cancer Maternal Grandmother     brain  . Cancer Maternal Grandfather     lung  . Diabetes Paternal Grandmother     Allergies  Allergen Reactions  . Sulfa Antibiotics Shortness Of Breath  . Codeine Phosphate Rash    Current Outpatient Prescriptions on File Prior to Visit  Medication Sig Dispense Refill  . Prenatal Vit-Fe Fumarate-FA (MULTIVITAMIN-PRENATAL) 27-0.8 MG TABS tablet Take 1 tablet by mouth daily at 12 noon.     No current facility-administered medications on file prior to visit.    BP 100/60 mmHg  Pulse 85  Temp(Src) 98.2 F (36.8 C) (Oral)  Ht 5\' 7"  (1.702 m)  Wt 128 lb (58.06 kg)  BMI 20.04 kg/m2  SpO2 98%  LMP 08/11/2014    Objective:   Physical Exam  Constitutional: She does not appear ill.  Neck: Muscular tenderness present. Decreased range of motion present. No Kernig's sign noted.  Cardiovascular: Normal rate and regular rhythm.   Pulmonary/Chest: Effort normal and breath sounds normal.  Musculoskeletal:       Left shoulder: She exhibits tenderness and pain. She exhibits normal range of motion.  Skin: Skin  is warm and dry.          Assessment & Plan:  Muscle spasm:  Present to right posterior neck and left trapezius muscle. Decreased ROM to flexion and lateral movement. No fevers, negative kernigs sign. Tylenol, application of heat and ice to affected areas, rest. No muscle relaxers or pain medication due to pregnancy. Follow up if no improvement in the next several weeks.

## 2014-12-12 NOTE — Progress Notes (Signed)
Pre visit review using our clinic review tool, if applicable. No additional management support is needed unless otherwise documented below in the visit note. 

## 2014-12-24 ENCOUNTER — Other Ambulatory Visit: Payer: Self-pay | Admitting: Obstetrics & Gynecology

## 2014-12-24 ENCOUNTER — Ambulatory Visit (HOSPITAL_COMMUNITY)
Admission: RE | Admit: 2014-12-24 | Discharge: 2014-12-24 | Disposition: A | Discharge status: Home / Self Care | Payer: 59 | Source: Ambulatory Visit | Attending: Obstetrics & Gynecology | Admitting: Obstetrics & Gynecology

## 2014-12-24 ENCOUNTER — Ambulatory Visit (HOSPITAL_COMMUNITY): Payer: 59

## 2014-12-24 DIAGNOSIS — Z3689 Encounter for other specified antenatal screening: Secondary | ICD-10-CM | POA: Insufficient documentation

## 2014-12-24 DIAGNOSIS — Z3A19 19 weeks gestation of pregnancy: Secondary | ICD-10-CM | POA: Insufficient documentation

## 2014-12-24 DIAGNOSIS — Z3482 Encounter for supervision of other normal pregnancy, second trimester: Secondary | ICD-10-CM

## 2014-12-24 DIAGNOSIS — Z36 Encounter for antenatal screening of mother: Secondary | ICD-10-CM | POA: Insufficient documentation

## 2015-01-07 ENCOUNTER — Encounter: Payer: Self-pay | Admitting: Family Medicine

## 2015-01-07 ENCOUNTER — Encounter: Payer: Self-pay | Admitting: *Deleted

## 2015-01-07 ENCOUNTER — Ambulatory Visit (INDEPENDENT_AMBULATORY_CARE_PROVIDER_SITE_OTHER): Payer: 59 | Admitting: Family Medicine

## 2015-01-07 VITALS — BP 95/62 | HR 88 | Wt 131.0 lb

## 2015-01-07 DIAGNOSIS — Z3492 Encounter for supervision of normal pregnancy, unspecified, second trimester: Secondary | ICD-10-CM

## 2015-01-07 DIAGNOSIS — L709 Acne, unspecified: Secondary | ICD-10-CM

## 2015-01-07 NOTE — Progress Notes (Signed)
Subjective:  Joanna Morgan is a 29 y.o. G2P1001 at [redacted]w[redacted]d being seen today for ongoing prenatal care.  Patient reports acne on her back.  Contractions: Not present.  Vag. Bleeding: None. Movement: Present. Denies leaking of fluid.   The following portions of the patient's history were reviewed and updated as appropriate: allergies, current medications, past family history, past medical history, past social history, past surgical history and problem list.   Objective:   Filed Vitals:   01/07/15 1027  BP: 95/62  Pulse: 88  Weight: 131 lb (59.421 kg)    Fetal Status: Fetal Heart Rate (bpm): 145   Movement: Present     General:  Alert, oriented and cooperative. Patient is in no acute distress.  Skin: Skin is warm and dry. No rash noted.   Cardiovascular: Normal heart rate noted  Respiratory: Normal respiratory effort, no problems with respiration noted  Abdomen: Soft, gravid, appropriate for gestational age. Pain/Pressure: Absent     Vaginal: Vag. Bleeding: None.    Vag D/C Character: Thin  Extremities: Normal range of motion.  Edema: None  Mental Status: Normal mood and affect. Normal behavior. Normal judgment and thought content.   Urinalysis:Prot neg, Gluc neg      Assessment and Plan:  Pregnancy: G2P1001 at [redacted]w[redacted]d  1. Supervision of normal pregnancy, second trimester Normal anatomy. Continue routine prenatal care.    2. Acne, unspecified acne type  - Ambulatory referral to Dermatology   Term labor symptoms and general obstetric precautions including but not limited to vaginal bleeding, contractions, leaking of fluid and fetal movement were reviewed in detail with the patient.  Please refer to After Visit Summary for other counseling recommendations.   Return in 4 weeks (on 02/04/2015).   Donnamae Jude, MD

## 2015-01-07 NOTE — Patient Instructions (Signed)
Second Trimester of Pregnancy The second trimester is from week 13 through week 28, months 4 through 6. The second trimester is often a time when you feel your best. Your body has also adjusted to being pregnant, and you begin to feel better physically. Usually, morning sickness has lessened or quit completely, you may have more energy, and you may have an increase in appetite. The second trimester is also a time when the fetus is growing rapidly. At the end of the sixth month, the fetus is about 9 inches long and weighs about 1 pounds. You will likely begin to feel the baby move (quickening) between 18 and 20 weeks of the pregnancy. BODY CHANGES Your body goes through many changes during pregnancy. The changes vary from woman to woman.   Your weight will continue to increase. You will notice your lower abdomen bulging out.  You may begin to get stretch marks on your hips, abdomen, and breasts.  You may develop headaches that can be relieved by medicines approved by your health care provider.  You may urinate more often because the fetus is pressing on your bladder.  You may develop or continue to have heartburn as a result of your pregnancy.  You may develop constipation because certain hormones are causing the muscles that push waste through your intestines to slow down.  You may develop hemorrhoids or swollen, bulging veins (varicose veins).  You may have back pain because of the weight gain and pregnancy hormones relaxing your joints between the bones in your pelvis and as a result of a shift in weight and the muscles that support your balance.  Your breasts will continue to grow and be tender.  Your gums may bleed and may be sensitive to brushing and flossing.  Dark spots or blotches (chloasma, mask of pregnancy) may develop on your face. This will likely fade after the baby is born.  A dark line from your belly button to the pubic area (linea nigra) may appear. This will likely  fade after the baby is born.  You may have changes in your hair. These can include thickening of your hair, rapid growth, and changes in texture. Some women also have hair loss during or after pregnancy, or hair that feels dry or thin. Your hair will most likely return to normal after your baby is born. WHAT TO EXPECT AT YOUR PRENATAL VISITS During a routine prenatal visit:  You will be weighed to make sure you and the fetus are growing normally.  Your blood pressure will be taken.  Your abdomen will be measured to track your baby's growth.  The fetal heartbeat will be listened to.  Any test results from the previous visit will be discussed. Your health care provider may ask you:  How you are feeling.  If you are feeling the baby move.  If you have had any abnormal symptoms, such as leaking fluid, bleeding, severe headaches, or abdominal cramping.  If you have any questions. Other tests that may be performed during your second trimester include:  Blood tests that check for:  Low iron levels (anemia).  Gestational diabetes (between 24 and 28 weeks).  Rh antibodies.  Urine tests to check for infections, diabetes, or protein in the urine.  An ultrasound to confirm the proper growth and development of the baby.  An amniocentesis to check for possible genetic problems.  Fetal screens for spina bifida and Down syndrome. HOME CARE INSTRUCTIONS   Avoid all smoking, herbs, alcohol, and unprescribed   drugs. These chemicals affect the formation and growth of the baby.  Follow your health care provider's instructions regarding medicine use. There are medicines that are either safe or unsafe to take during pregnancy.  Exercise only as directed by your health care provider. Experiencing uterine cramps is a good sign to stop exercising.  Continue to eat regular, healthy meals.  Wear a good support bra for breast tenderness.  Do not use hot tubs, steam rooms, or saunas.  Wear  your seat belt at all times when driving.  Avoid raw meat, uncooked cheese, cat litter boxes, and soil used by cats. These carry germs that can cause birth defects in the baby.  Take your prenatal vitamins.  Try taking a stool softener (if your health care provider approves) if you develop constipation. Eat more high-fiber foods, such as fresh vegetables or fruit and whole grains. Drink plenty of fluids to keep your urine clear or pale yellow.  Take warm sitz baths to soothe any pain or discomfort caused by hemorrhoids. Use hemorrhoid cream if your health care provider approves.  If you develop varicose veins, wear support hose. Elevate your feet for 15 minutes, 3-4 times a day. Limit salt in your diet.  Avoid heavy lifting, wear low heel shoes, and practice good posture.  Rest with your legs elevated if you have leg cramps or low back pain.  Visit your dentist if you have not gone yet during your pregnancy. Use a soft toothbrush to brush your teeth and be gentle when you floss.  A sexual relationship may be continued unless your health care provider directs you otherwise.  Continue to go to all your prenatal visits as directed by your health care provider. SEEK MEDICAL CARE IF:   You have dizziness.  You have mild pelvic cramps, pelvic pressure, or nagging pain in the abdominal area.  You have persistent nausea, vomiting, or diarrhea.  You have a bad smelling vaginal discharge.  You have pain with urination. SEEK IMMEDIATE MEDICAL CARE IF:   You have a fever.  You are leaking fluid from your vagina.  You have spotting or bleeding from your vagina.  You have severe abdominal cramping or pain.  You have rapid weight gain or loss.  You have shortness of breath with chest pain.  You notice sudden or extreme swelling of your face, hands, ankles, feet, or legs.  You have not felt your baby move in over an hour.  You have severe headaches that do not go away with  medicine.  You have vision changes. Document Released: 06/21/2001 Document Revised: 07/02/2013 Document Reviewed: 08/28/2012 [employer] Patient Information 2015 [employer]. This information is not intended to replace advice given to you by your health care provider. Make sure you discuss any questions you have with your health care provider.  Breastfeeding Deciding to breastfeed is one of the best choices you can make for you and your baby. A change in hormones during pregnancy causes your breast tissue to grow and increases the number and size of your milk ducts. These hormones also allow proteins, sugars, and fats from your blood supply to make breast milk in your milk-producing glands. Hormones prevent breast milk from being released before your baby is born as well as prompt milk flow after birth. Once breastfeeding has begun, thoughts of your baby, as well as his or her sucking or crying, can stimulate the release of milk from your milk-producing glands.  BENEFITS OF BREASTFEEDING For Your Baby  Your first   milk (colostrum) helps your baby's digestive system function better.   There are antibodies in your milk that help your baby fight off infections.   Your baby has a lower incidence of asthma, allergies, and sudden infant death syndrome.   The nutrients in breast milk are better for your baby than infant formulas and are designed uniquely for your baby's needs.   Breast milk improves your baby's brain development.   Your baby is less likely to develop other conditions, such as childhood obesity, asthma, or type 2 diabetes mellitus.  For You   Breastfeeding helps to create a very special bond between you and your baby.   Breastfeeding is convenient. Breast milk is always available at the correct temperature and costs nothing.   Breastfeeding helps to burn calories and helps you lose the weight gained during pregnancy.   Breastfeeding makes your uterus contract to its  prepregnancy size faster and slows bleeding (lochia) after you give birth.   Breastfeeding helps to lower your risk of developing type 2 diabetes mellitus, osteoporosis, and breast or ovarian cancer later in life. SIGNS THAT YOUR BABY IS HUNGRY Early Signs of Hunger  Increased alertness or activity.  Stretching.  Movement of the head from side to side.  Movement of the head and opening of the mouth when the corner of the mouth or cheek is stroked (rooting).  Increased sucking sounds, smacking lips, cooing, sighing, or squeaking.  Hand-to-mouth movements.  Increased sucking of fingers or hands. Late Signs of Hunger  Fussing.  Intermittent crying. Extreme Signs of Hunger Signs of extreme hunger will require calming and consoling before your baby will be able to breastfeed successfully. Do not wait for the following signs of extreme hunger to occur before you initiate breastfeeding:   Restlessness.  A loud, strong cry.   Screaming. BREASTFEEDING BASICS Breastfeeding Initiation  Find a comfortable place to sit or lie down, with your neck and back well supported.  Place a pillow or rolled up blanket under your baby to bring him or her to the level of your breast (if you are seated). Nursing pillows are specially designed to help support your arms and your baby while you breastfeed.  Make sure that your baby's abdomen is facing your abdomen.   Gently massage your breast. With your fingertips, massage from your chest wall toward your nipple in a circular motion. This encourages milk flow. You may need to continue this action during the feeding if your milk flows slowly.  Support your breast with 4 fingers underneath and your thumb above your nipple. Make sure your fingers are well away from your nipple and your baby's mouth.   Stroke your baby's lips gently with your finger or nipple.   When your baby's mouth is open wide enough, quickly bring your baby to your breast,  placing your entire nipple and as much of the colored area around your nipple (areola) as possible into your baby's mouth.   More areola should be visible above your baby's upper lip than below the lower lip.   Your baby's tongue should be between his or her lower gum and your breast.   Ensure that your baby's mouth is correctly positioned around your nipple (latched). Your baby's lips should create a seal on your breast and be turned out (everted).  It is common for your baby to suck about 2-3 minutes in order to start the flow of breast milk. Latching Teaching your baby how to latch on to your breast   properly is very important. An improper latch can cause nipple pain and decreased milk supply for you and poor weight gain in your baby. Also, if your baby is not latched onto your nipple properly, he or she may swallow some air during feeding. This can make your baby fussy. Burping your baby when you switch breasts during the feeding can help to get rid of the air. However, teaching your baby to latch on properly is still the best way to prevent fussiness from swallowing air while breastfeeding. Signs that your baby has successfully latched on to your nipple:    Silent tugging or silent sucking, without causing you pain.   Swallowing heard between every 3-4 sucks.    Muscle movement above and in front of his or her ears while sucking.  Signs that your baby has not successfully latched on to nipple:   Sucking sounds or smacking sounds from your baby while breastfeeding.  Nipple pain. If you think your baby has not latched on correctly, slip your finger into the corner of your baby's mouth to break the suction and place it between your baby's gums. Attempt breastfeeding initiation again. Signs of Successful Breastfeeding Signs from your baby:   A gradual decrease in the number of sucks or complete cessation of sucking.   Falling asleep.   Relaxation of his or her body.    Retention of a small amount of milk in his or her mouth.   Letting go of your breast by himself or herself. Signs from you:  Breasts that have increased in firmness, weight, and size 1-3 hours after feeding.   Breasts that are softer immediately after breastfeeding.  Increased milk volume, as well as a change in milk consistency and color by the fifth day of breastfeeding.   Nipples that are not sore, cracked, or bleeding. Signs That Your Baby is Getting Enough Milk  Wetting at least 3 diapers in a 24-hour period. The urine should be clear and pale yellow by age 5 days.  At least 3 stools in a 24-hour period by age 5 days. The stool should be soft and yellow.  At least 3 stools in a 24-hour period by age 7 days. The stool should be seedy and yellow.  No loss of weight greater than 10% of birth weight during the first 3 days of age.  Average weight gain of 4-7 ounces (113-198 g) per week after age 4 days.  Consistent daily weight gain by age 5 days, without weight loss after the age of 2 weeks. After a feeding, your baby may spit up a small amount. This is common. BREASTFEEDING FREQUENCY AND DURATION Frequent feeding will help you make more milk and can prevent sore nipples and breast engorgement. Breastfeed when you feel the need to reduce the fullness of your breasts or when your baby shows signs of hunger. This is called "breastfeeding on demand." Avoid introducing a pacifier to your baby while you are working to establish breastfeeding (the first 4-6 weeks after your baby is born). After this time you may choose to use a pacifier. Research has shown that pacifier use during the first year of a baby's life decreases the risk of sudden infant death syndrome (SIDS). Allow your baby to feed on each breast as long as he or she wants. Breastfeed until your baby is finished feeding. When your baby unlatches or falls asleep while feeding from the first breast, offer the second breast.  Because newborns are often sleepy in the   first few weeks of life, you may need to awaken your baby to get him or her to feed. Breastfeeding times will vary from baby to baby. However, the following rules can serve as a guide to help you ensure that your baby is properly fed:  Newborns (babies 4 weeks of age or younger) may breastfeed every 1-3 hours.  Newborns should not go longer than 3 hours during the day or 5 hours during the night without breastfeeding.  You should breastfeed your baby a minimum of 8 times in a 24-hour period until you begin to introduce solid foods to your baby at around 6 months of age. BREAST MILK PUMPING Pumping and storing breast milk allows you to ensure that your baby is exclusively fed your breast milk, even at times when you are unable to breastfeed. This is especially important if you are going back to work while you are still breastfeeding or when you are not able to be present during feedings. Your lactation consultant can give you guidelines on how long it is safe to store breast milk.  A breast pump is a machine that allows you to pump milk from your breast into a sterile bottle. The pumped breast milk can then be stored in a refrigerator or freezer. Some breast pumps are operated by hand, while others use electricity. Ask your lactation consultant which type will work best for you. Breast pumps can be purchased, but some hospitals and breastfeeding support groups lease breast pumps on a monthly basis. A lactation consultant can teach you how to hand express breast milk, if you prefer not to use a pump.  CARING FOR YOUR BREASTS WHILE YOU BREASTFEED Nipples can become dry, cracked, and sore while breastfeeding. The following recommendations can help keep your breasts moisturized and healthy:  Avoid using soap on your nipples.   Wear a supportive bra. Although not required, special nursing bras and tank tops are designed to allow access to your breasts for  breastfeeding without taking off your entire bra or top. Avoid wearing underwire-style bras or extremely tight bras.  Air dry your nipples for 3-4minutes after each feeding.   Use only cotton bra pads to absorb leaked breast milk. Leaking of breast milk between feedings is normal.   Use lanolin on your nipples after breastfeeding. Lanolin helps to maintain your skin's normal moisture barrier. If you use pure lanolin, you do not need to wash it off before feeding your baby again. Pure lanolin is not toxic to your baby. You may also hand express a few drops of breast milk and gently massage that milk into your nipples and allow the milk to air dry. In the first few weeks after giving birth, some women experience extremely full breasts (engorgement). Engorgement can make your breasts feel heavy, warm, and tender to the touch. Engorgement peaks within 3-5 days after you give birth. The following recommendations can help ease engorgement:  Completely empty your breasts while breastfeeding or pumping. You may want to start by applying warm, moist heat (in the shower or with warm water-soaked hand towels) just before feeding or pumping. This increases circulation and helps the milk flow. If your baby does not completely empty your breasts while breastfeeding, pump any extra milk after he or she is finished.  Wear a snug bra (nursing or regular) or tank top for 1-2 days to signal your body to slightly decrease milk production.  Apply ice packs to your breasts, unless this is too uncomfortable for you.    Make sure that your baby is latched on and positioned properly while breastfeeding. If engorgement persists after 48 hours of following these recommendations, contact your health care provider or a lactation consultant. OVERALL HEALTH CARE RECOMMENDATIONS WHILE BREASTFEEDING  Eat healthy foods. Alternate between meals and snacks, eating 3 of each per day. Because what you eat affects your breast milk,  some of the foods may make your baby more irritable than usual. Avoid eating these foods if you are sure that they are negatively affecting your baby.  Drink milk, fruit juice, and water to satisfy your thirst (about 10 glasses a day).   Rest often, relax, and continue to take your prenatal vitamins to prevent fatigue, stress, and anemia.  Continue breast self-awareness checks.  Avoid chewing and smoking tobacco.  Avoid alcohol and drug use. Some medicines that may be harmful to your baby can pass through breast milk. It is important to ask your health care provider before taking any medicine, including all over-the-counter and prescription medicine as well as vitamin and herbal supplements. It is possible to become pregnant while breastfeeding. If birth control is desired, ask your health care provider about options that will be safe for your baby. SEEK MEDICAL CARE IF:   You feel like you want to stop breastfeeding or have become frustrated with breastfeeding.  You have painful breasts or nipples.  Your nipples are cracked or bleeding.  Your breasts are red, tender, or warm.  You have a swollen area on either breast.  You have a fever or chills.  You have nausea or vomiting.  You have drainage other than breast milk from your nipples.  Your breasts do not become full before feedings by the fifth day after you give birth.  You feel sad and depressed.  Your baby is too sleepy to eat well.  Your baby is having trouble sleeping.   Your baby is wetting less than 3 diapers in a 24-hour period.  Your baby has less than 3 stools in a 24-hour period.  Your baby's skin or the white part of his or her eyes becomes yellow.   Your baby is not gaining weight by 5 days of age. SEEK IMMEDIATE MEDICAL CARE IF:   Your baby is overly tired (lethargic) and does not want to wake up and feed.  Your baby develops an unexplained fever. Document Released: 06/27/2005 Document Revised:  07/02/2013 Document Reviewed: 12/19/2012 [employer] Patient Information 2015 [employer]. This information is not intended to replace advice given to you by your health care provider. Make sure you discuss any questions you have with your health care provider.  

## 2015-01-28 ENCOUNTER — Ambulatory Visit (INDEPENDENT_AMBULATORY_CARE_PROVIDER_SITE_OTHER): Payer: 59 | Admitting: Obstetrics & Gynecology

## 2015-01-28 VITALS — BP 89/59 | HR 73 | Wt 135.0 lb

## 2015-01-28 DIAGNOSIS — Z3492 Encounter for supervision of normal pregnancy, unspecified, second trimester: Secondary | ICD-10-CM

## 2015-01-28 DIAGNOSIS — Z3482 Encounter for supervision of other normal pregnancy, second trimester: Secondary | ICD-10-CM

## 2015-01-28 NOTE — Progress Notes (Signed)
Subjective:  Joanna Morgan is a 29 y.o. G2P1001 at [redacted]w[redacted]d being seen today for ongoing prenatal care.  Patient reports no complaints.  Has been to dermatologist for acne, prescribed medication that she could not afford so holding off for now.  Contractions: Not present.  Vag. Bleeding: None. Movement: Present. Denies leaking of fluid.   The following portions of the patient's history were reviewed and updated as appropriate: allergies, current medications, past family history, past medical history, past social history, past surgical history and problem list.   Objective:   Filed Vitals:   01/28/15 1028  BP: 89/59  Pulse: 73  Weight: 135 lb (61.236 kg)   Fetal Status: Fetal Heart Rate (bpm): 145 Fundal Height: 24 cm Movement: Present     General:  Alert, oriented and cooperative. Patient is in no acute distress.  Skin: Skin is warm and dry. No rash noted.   Cardiovascular: Normal heart rate noted  Respiratory: Normal respiratory effort, no problems with respiration noted  Abdomen: Soft, gravid, appropriate for gestational age. Pain/Pressure: Absent     Vaginal: Vag. Bleeding: None.    Vag D/C Character: Thin  Extremities: Normal range of motion.  Edema: None  Mental Status: Normal mood and affect. Normal behavior. Normal judgment and thought content.   Urinalysis: Urine Protein: Negative Urine Glucose: Negative  Assessment and Plan:  Pregnancy: G2P1001 at [redacted]w[redacted]d  1. Supervision of normal pregnancy, second trimester Preterm labor symptoms and general obstetric precautions including but not limited to vaginal bleeding, contractions, leaking of fluid and fetal movement were reviewed in detail with the patient. Please refer to After Visit Summary for other counseling recommendations.  Return in about 4 weeks (around 02/25/2015) for 1 hr GTT, 3rd trimester labs, OB Visit.   Osborne Oman, MD

## 2015-01-28 NOTE — Patient Instructions (Signed)
Return to clinic for any obstetric concerns or go to [facility] for evaluation  

## 2015-02-25 ENCOUNTER — Ambulatory Visit (INDEPENDENT_AMBULATORY_CARE_PROVIDER_SITE_OTHER): Payer: 59 | Admitting: Obstetrics & Gynecology

## 2015-02-25 ENCOUNTER — Encounter: Payer: Self-pay | Admitting: Obstetrics & Gynecology

## 2015-02-25 VITALS — BP 92/66 | HR 99 | Wt 139.0 lb

## 2015-02-25 DIAGNOSIS — Z36 Encounter for antenatal screening of mother: Secondary | ICD-10-CM

## 2015-02-25 DIAGNOSIS — Z3483 Encounter for supervision of other normal pregnancy, third trimester: Secondary | ICD-10-CM

## 2015-02-25 DIAGNOSIS — Z23 Encounter for immunization: Secondary | ICD-10-CM | POA: Diagnosis not present

## 2015-02-25 DIAGNOSIS — Z3493 Encounter for supervision of normal pregnancy, unspecified, third trimester: Secondary | ICD-10-CM

## 2015-02-25 LAB — CBC WITH DIFFERENTIAL/PLATELET
Basophils Absolute: 0 10*3/uL (ref 0.0–0.1)
Basophils Relative: 0 % (ref 0–1)
Eosinophils Absolute: 0.2 10*3/uL (ref 0.0–0.7)
Eosinophils Relative: 3 % (ref 0–5)
HCT: 35.9 % — ABNORMAL LOW (ref 36.0–46.0)
Hemoglobin: 11.7 g/dL — ABNORMAL LOW (ref 12.0–15.0)
Lymphocytes Relative: 20 % (ref 12–46)
Lymphs Abs: 1.4 10*3/uL (ref 0.7–4.0)
MCH: 30.7 pg (ref 26.0–34.0)
MCHC: 32.6 g/dL (ref 30.0–36.0)
MCV: 94.2 fL (ref 78.0–100.0)
MPV: 9.7 fL (ref 8.6–12.4)
Monocytes Absolute: 0.6 10*3/uL (ref 0.1–1.0)
Monocytes Relative: 8 % (ref 3–12)
Neutro Abs: 4.9 10*3/uL (ref 1.7–7.7)
Neutrophils Relative %: 69 % (ref 43–77)
Platelets: 209 10*3/uL (ref 150–400)
RBC: 3.81 MIL/uL — ABNORMAL LOW (ref 3.87–5.11)
RDW: 13.4 % (ref 11.5–15.5)
WBC: 7.1 10*3/uL (ref 4.0–10.5)

## 2015-02-25 NOTE — Progress Notes (Signed)
Subjective:  Joanna Morgan is a 29 y.o. MW G2P1001 (29yo Joanna Morgan) at [redacted]w[redacted]d being seen today for ongoing prenatal care.  Patient reports cocyx pain after a fracture years ago. I rec'd a Restaurant manager, [employer]..  Contractions: Not present.  Vag. Bleeding: None. Movement: Present. Denies leaking of fluid.   The following portions of the patient's history were reviewed and updated as appropriate: allergies, current medications, past family history, past medical history, past social history, past surgical history and problem list.   Objective:   Filed Vitals:   02/25/15 0917  BP: 92/66  Pulse: 99  Weight: 139 lb (63.05 kg)    Fetal Status: Fetal Heart Rate (bpm): 152   Movement: Present     General:  Alert, oriented and cooperative. Patient is in no acute distress.  Skin: Skin is warm and dry. No rash noted.   Cardiovascular: Normal heart rate noted  Respiratory: Normal respiratory effort, no problems with respiration noted  Abdomen: Soft, gravid, appropriate for gestational age. Pain/Pressure: Absent     Pelvic: Vag. Bleeding: None Vag D/C Character: Thin   Cervical exam deferred        Extremities: Normal range of motion.  Edema: None  Mental Status: Normal mood and affect. Normal behavior. Normal judgment and thought content.   Urinalysis: Urine Protein: Negative Urine Glucose: Negative  Assessment and Plan:  Pregnancy: G2P1001 at [redacted]w[redacted]d  1. Supervision of normal pregnancy, third trimester  - HIV antibody (with reflex) - RPR - CBC w/Diff - Glucose Tolerance, 1 HR (50g) w/o Fasting - Tdap vaccine greater than or equal to 7yo IM  Preterm labor symptoms and general obstetric precautions including but not limited to vaginal bleeding, contractions, leaking of fluid and fetal movement were reviewed in detail with the patient.  Please refer to After Visit Summary for other counseling recommendations.  Return in about 4 weeks (around 03/25/2015).   Emily Filbert, MD

## 2015-02-26 LAB — GLUCOSE TOLERANCE, 1 HOUR (50G) W/O FASTING: Glucose, 1 Hour GTT: 84 mg/dL (ref 70–140)

## 2015-02-26 LAB — HIV ANTIBODY (ROUTINE TESTING W REFLEX): HIV 1&2 Ab, 4th Generation: NONREACTIVE

## 2015-02-26 LAB — RPR

## 2015-03-25 ENCOUNTER — Ambulatory Visit (INDEPENDENT_AMBULATORY_CARE_PROVIDER_SITE_OTHER): Payer: 59 | Admitting: Obstetrics and Gynecology

## 2015-03-25 ENCOUNTER — Encounter: Payer: Self-pay | Admitting: Obstetrics and Gynecology

## 2015-03-25 VITALS — BP 98/62 | HR 80 | Wt 142.0 lb

## 2015-03-25 DIAGNOSIS — Z3483 Encounter for supervision of other normal pregnancy, third trimester: Secondary | ICD-10-CM

## 2015-03-25 DIAGNOSIS — N3010 Interstitial cystitis (chronic) without hematuria: Secondary | ICD-10-CM

## 2015-03-25 DIAGNOSIS — Z3493 Encounter for supervision of normal pregnancy, unspecified, third trimester: Secondary | ICD-10-CM

## 2015-03-25 NOTE — Progress Notes (Signed)
Subjective:  Joanna Morgan is a 29 y.o. G2P1001 at [redacted]w[redacted]d being seen today for ongoing prenatal care.  Patient reports no complaints.  Contractions: Not present.  Vag. Bleeding: None. Movement: Absent. Denies leaking of fluid.   The following portions of the patient's history were reviewed and updated as appropriate: allergies, current medications, past family history, past medical history, past social history, past surgical history and problem list.   Objective:   Filed Vitals:   03/25/15 0902  BP: 98/62  Pulse: 80  Weight: 142 lb (64.411 kg)    Fetal Status: Fetal Heart Rate (bpm): 151   Movement: Absent     General:  Alert, oriented and cooperative. Patient is in no acute distress.  Skin: Skin is warm and dry. No rash noted.   Cardiovascular: Normal heart rate noted  Respiratory: Normal respiratory effort, no problems with respiration noted  Abdomen: Soft, gravid, appropriate for gestational age. Pain/Pressure: Absent     Pelvic: Vag. Bleeding: None Vag D/C Character: Thin   Cervical exam deferred        Extremities: Normal range of motion.  Edema: None  Mental Status: Normal mood and affect. Normal behavior. Normal judgment and thought content.   Urinalysis: Urine Protein: Negative Urine Glucose: Negative  Assessment and Plan:  Pregnancy: G2P1001 at [redacted]w[redacted]d  1. Supervision of normal pregnancy, third trimester Patient is doing well. She reports lower abdominal pain particularly in her groin- recommended maternity support belt  2. Chronic interstitial cystitis   Preterm labor symptoms and general obstetric precautions including but not limited to vaginal bleeding, contractions, leaking of fluid and fetal movement were reviewed in detail with the patient. Please refer to After Visit Summary for other counseling recommendations.  Return in about 2 weeks (around 04/08/2015).   Mora Bellman, MD

## 2015-04-08 ENCOUNTER — Ambulatory Visit (INDEPENDENT_AMBULATORY_CARE_PROVIDER_SITE_OTHER): Payer: 59 | Admitting: Obstetrics & Gynecology

## 2015-04-08 VITALS — BP 94/63 | HR 70 | Wt 144.0 lb

## 2015-04-08 DIAGNOSIS — Z3493 Encounter for supervision of normal pregnancy, unspecified, third trimester: Secondary | ICD-10-CM

## 2015-04-08 DIAGNOSIS — Z23 Encounter for immunization: Secondary | ICD-10-CM

## 2015-04-08 DIAGNOSIS — Z3483 Encounter for supervision of other normal pregnancy, third trimester: Secondary | ICD-10-CM

## 2015-04-08 NOTE — Patient Instructions (Signed)
Return to clinic for any obstetric concerns or go to [facility] for evaluation  

## 2015-04-08 NOTE — Progress Notes (Signed)
Subjective:  Joanna Morgan is a 29 y.o. G2P1001 at [redacted]w[redacted]d being seen today for ongoing prenatal care.  Patient reports no complaints.  Contractions: Irregular.  Vag. Bleeding: None. Movement: Present. Denies leaking of fluid.   The following portions of the patient's history were reviewed and updated as appropriate: allergies, current medications, past family history, past medical history, past social history, past surgical history and problem list.   Objective:   Filed Vitals:   04/08/15 0934  BP: 94/63  Pulse: 70  Weight: 144 lb (65.318 kg)    Fetal Status: Fetal Heart Rate (bpm): 138 Fundal Height: 34 cm Movement: Present     General:  Alert, oriented and cooperative. Patient is in no acute distress.  Skin: Skin is warm and dry. No rash noted.   Cardiovascular: Normal heart rate noted  Respiratory: Normal respiratory effort, no problems with respiration noted  Abdomen: Soft, gravid, appropriate for gestational age. Pain/Pressure: Absent     Pelvic: Vag. Bleeding: None Vag D/C Character: Thin  Cervical exam deferred        Extremities: Normal range of motion.  Edema: None  Mental Status: Normal mood and affect. Normal behavior. Normal judgment and thought content.   Urinalysis: Urine Protein: Negative Urine Glucose: Negative  Assessment and Plan:  Pregnancy: G2P1001 at [redacted]w[redacted]d  1. Supervision of normal pregnancy, third trimester - Flu Vaccine QUAD 36+ mos IM (Fluarix, Quad PF) given today Preterm labor symptoms and general obstetric precautions including but not limited to vaginal bleeding, contractions, leaking of fluid and fetal movement were reviewed in detail with the patient. Please refer to After Visit Summary for other counseling recommendations.  Return in about 2 weeks (around 04/22/2015) for OB visit, pelvic cultures.   Osborne Oman, MD

## 2015-04-23 ENCOUNTER — Encounter: Payer: 59 | Admitting: Obstetrics and Gynecology

## 2015-04-28 ENCOUNTER — Ambulatory Visit (INDEPENDENT_AMBULATORY_CARE_PROVIDER_SITE_OTHER): Payer: 59 | Admitting: Physician Assistant

## 2015-04-28 VITALS — BP 102/68 | HR 86 | Wt 146.4 lb

## 2015-04-28 DIAGNOSIS — Z36 Encounter for antenatal screening of mother: Secondary | ICD-10-CM

## 2015-04-28 DIAGNOSIS — Z3483 Encounter for supervision of other normal pregnancy, third trimester: Secondary | ICD-10-CM

## 2015-04-28 DIAGNOSIS — Z3493 Encounter for supervision of normal pregnancy, unspecified, third trimester: Secondary | ICD-10-CM

## 2015-04-28 DIAGNOSIS — Z113 Encounter for screening for infections with a predominantly sexual mode of transmission: Secondary | ICD-10-CM | POA: Diagnosis not present

## 2015-04-28 LAB — OB RESULTS CONSOLE GBS: GBS: POSITIVE

## 2015-04-28 NOTE — Patient Instructions (Signed)
Pain Relief During Labor and Delivery Everyone experiences pain differently, but labor causes severe pain for many women. The amount of pain you experience during labor and delivery depends on your pain tolerance, contraction strength, and your baby's size and position. There are many ways to prepare for and deal with the pain, including:   Taking prenatal classes to learn about labor and delivery. The more informed you are, the less anxious and afraid you may be. This can help lessen the pain.  Taking pain-relieving medicine during labor and delivery.  Learning breathing and relaxation techniques.  Taking a shower or bath.  Getting massaged.  Changing positions.  Placing an ice pack on your back. Discuss your pain control options with your health care provider during your prenatal visits.  WHAT ARE THE TWO TYPES OF PAIN-RELIEVING MEDICINES? 1. Analgesics. These are medicines that decrease pain without total loss of feeling or muscle movement. 2. Anesthetics. These are medicines that block all feeling, including pain. There can be minor side effects of both types, such as nausea, trouble concentrating, becoming sleepy, and lowering the heart rate of the baby. However, health care providers are careful to give doses that will not seriously affect the baby.  WHAT ARE THE SPECIFIC TYPES OF ANALGESICS AND ANESTHETICS? Systemic Analgesic Systemic pain medicines affect your whole body rather than focusing pain relief on the area of your body experiencing pain. This type of medicine is given either through an IV tube in your vein or by a shot (injection) into your muscle. This medicine will lessen your pain but will not stop it completely. It may also make you sleepy, but it will not make you lose consciousness.  Local Anesthetic Local anesthetic isused tonumb a small area of your body. The medicine is injected into the area of nerves that carry feeling to the vagina, vulva, or the area between  the vagina and anus (perineum).  General Anesthetic This type of medicine causes you to lose consciousness so you do not feel pain. It is usually used only in emergency situations during labor. It is given through an IV tube or face mask. Paracervical Block A paracervical block is a form of local anesthesia given during labor. Numbing medicine is injected into the right and left sides of the cervix and vagina. It helps to lessen the pain caused by contractions and stretching of the cervix. It may have to be given more than once.  Pudendal Block A pudendal block is another form of local anesthesia. It is used to relieve the pain associated with pushing or stretching of the perineum at the time of delivery. An injection is given deep through the vaginal wall into the pudendal nerve in the pelvis, numbing the perineum.  Epidural Anesthetic An epidural is an injection of numbing medicine given in the lower back and into the epidural space near your spinal cord. The epidural numbs the lower half of your body. You may be able to move your legs but will not be allowed to walk. Epidurals can be used for labor, delivery, or cesarean deliveries.  To prevent the medicine from wearing off, a small tube (catheter) may be threaded into the epidural space and taped in place to prevent it from slipping out. Medicine can then be given continuously in small doses through the tube until you deliver. Spinal Block A spinal block is similar to an epidural, but the medicine is injected into the spinal fluid, not the epidural space. A spinal block is only given   once. It starts to relieve pain quickly but lasts only 1-2 hours. Spinal blocks can also be used for cesarean deliveries.  Combined Spinal-Epidural Block Combined spinal-epidural blocks combine the benefits of both the spinal and epidural blocks. The spinal part acts quickly to relieve pain and the epidural provides continuous pain relief. Hydrotherapy Immersion in  warm water during labor may provide comfort and relaxation. It may also help to lessen pain, the use of anesthesia, and the length of labor. However, immersion in water during the delivery (water birth) may have some risk involved and studies to determine safety and risks are ongoing. If you are a healthy woman who is expecting an uncomplicated birth, talk with your health care provider to see if water birth is an option for you.    This information is not intended to replace advice given to you by your health care provider. Make sure you discuss any questions you have with your health care provider.   Document Released: 10/13/2008 Document Revised: 07/02/2013 Document Reviewed: 11/15/2012 Elsevier Interactive Patient Education [employer].

## 2015-04-28 NOTE — Progress Notes (Signed)
Subjective:  Joanna Morgan is a 29 y.o. G2P1001 at [redacted]w[redacted]d being seen today for ongoing prenatal care.  Patient reports occasional contractions.  Contractions: Irregular.  Vag. Bleeding: None. Movement: Present. Denies leaking of fluid.   The following portions of the patient's history were reviewed and updated as appropriate: allergies, current medications, past family history, past medical history, past social history, past surgical history and problem list.   Objective:   Filed Vitals:   04/28/15 0923  BP: 102/68  Pulse: 86  Weight: 146 lb 6.4 oz (66.407 kg)    Fetal Status: Fetal Heart Rate (bpm): 138   Movement: Present     General:  Alert, oriented and cooperative. Patient is in no acute distress.  Skin: Skin is warm and dry. No rash noted.   Cardiovascular: Normal heart rate noted  Respiratory: Normal respiratory effort, no problems with respiration noted  Abdomen: Soft, gravid, appropriate for gestational age. Pain/Pressure: Absent     Pelvic: Vag. Bleeding: None Vag D/C Character: Thin   Cervical exam performed        Extremities: Normal range of motion.  Edema: None  Mental Status: Normal mood and affect. Normal behavior. Normal judgment and thought content.   Urinalysis: Urine Protein: Negative Urine Glucose: Negative  Assessment and Plan:  Pregnancy: G2P1001 at [redacted]w[redacted]d  1. Supervision of normal pregnancy in third trimester  - Culture, beta strep (group b only) - GC/Chlamydia probe amp ([place])not at [facility]  Term labor symptoms and general obstetric precautions including but not limited to vaginal bleeding, contractions, leaking of fluid and fetal movement were reviewed in detail with the patient. Please refer to After Visit Summary for other counseling recommendations.  Return in about 1 week (around 05/05/2015) for OBF.   Paticia Stack, PA-C

## 2015-04-29 LAB — GC/CHLAMYDIA PROBE AMP (~~LOC~~) NOT AT ARMC
Chlamydia: NEGATIVE
Neisseria Gonorrhea: NEGATIVE

## 2015-05-01 LAB — CULTURE, BETA STREP (GROUP B ONLY)

## 2015-05-05 ENCOUNTER — Ambulatory Visit (INDEPENDENT_AMBULATORY_CARE_PROVIDER_SITE_OTHER): Payer: 59 | Admitting: Family Medicine

## 2015-05-05 VITALS — BP 107/78 | HR 91 | Wt 148.0 lb

## 2015-05-05 DIAGNOSIS — O99820 Streptococcus B carrier state complicating pregnancy: Secondary | ICD-10-CM

## 2015-05-05 DIAGNOSIS — Z3483 Encounter for supervision of other normal pregnancy, third trimester: Secondary | ICD-10-CM

## 2015-05-05 DIAGNOSIS — Z3493 Encounter for supervision of normal pregnancy, unspecified, third trimester: Secondary | ICD-10-CM

## 2015-05-05 DIAGNOSIS — Z22330 Carrier of Group B streptococcus: Secondary | ICD-10-CM

## 2015-05-05 NOTE — Progress Notes (Signed)
Subjective:  Joanna Morgan is a 29 y.o. G2P1001 at [redacted]w[redacted]d being seen today for ongoing prenatal care.  Patient reports no complaints.  Contractions: Irregular.  Vag. Bleeding: None. Movement: Present. Denies leaking of fluid.   The following portions of the patient's history were reviewed and updated as appropriate: allergies, current medications, past family history, past medical history, past social history, past surgical history and problem list. Problem list updated.  Objective:   Filed Vitals:   05/05/15 0954  BP: 107/78  Pulse: 91  Weight: 148 lb (67.132 kg)    Fetal Status: Fetal Heart Rate (bpm): 138 Fundal Height: 36 cm Movement: Present  Presentation: Vertex  General:  Alert, oriented and cooperative. Patient is in no acute distress.  Skin: Skin is warm and dry. No rash noted.   Cardiovascular: Normal heart rate noted  Respiratory: Normal respiratory effort, no problems with respiration noted  Abdomen: Soft, gravid, appropriate for gestational age. Pain/Pressure: Present     Pelvic: Vag. Bleeding: None Vag D/C Character: Thin   Cervical exam performed Dilation: 2.5 Effacement (%): 70 Station: -2  Extremities: Normal range of motion.  Edema: None  Mental Status: Normal mood and affect. Normal behavior. Normal judgment and thought content.   Urinalysis: Urine Protein: Negative Urine Glucose: Negative  Assessment and Plan:  Pregnancy: G2P1001 at [redacted]w[redacted]d  1. Supervision of normal pregnancy, third trimester Continue routine prenatal care.  2. Group B Streptococcus carrier, +RV culture, currently pregnant Treatment in labor--discussed with pt.  Term labor symptoms and general obstetric precautions including but not limited to vaginal bleeding, contractions, leaking of fluid and fetal movement were reviewed in detail with the patient. Please refer to After Visit Summary for other counseling recommendations.  Return in 1 week (on 05/12/2015).   Donnamae Jude, MD

## 2015-05-05 NOTE — Patient Instructions (Signed)
Third Trimester of Pregnancy The third trimester is from week 29 through week 42, months 7 through 9. The third trimester is a time when the fetus is growing rapidly. At the end of the ninth month, the fetus is about 20 inches in length and weighs 6-10 pounds.  BODY CHANGES Your body goes through many changes during pregnancy. The changes vary from woman to woman.   Your weight will continue to increase. You can expect to gain 25-35 pounds (11-16 kg) by the end of the pregnancy.  You may begin to get stretch marks on your hips, abdomen, and breasts.  You may urinate more often because the fetus is moving lower into your pelvis and pressing on your bladder.  You may develop or continue to have heartburn as a result of your pregnancy.  You may develop constipation because certain hormones are causing the muscles that push waste through your intestines to slow down.  You may develop hemorrhoids or swollen, bulging veins (varicose veins).  You may have pelvic pain because of the weight gain and pregnancy hormones relaxing your joints between the bones in your pelvis. Backaches may result from overexertion of the muscles supporting your posture.  You may have changes in your hair. These can include thickening of your hair, rapid growth, and changes in texture. Some women also have hair loss during or after pregnancy, or hair that feels dry or thin. Your hair will most likely return to normal after your baby is born.  Your breasts will continue to grow and be tender. A yellow discharge may leak from your breasts called colostrum.  Your belly button may stick out.  You may feel short of breath because of your expanding uterus.  You may notice the fetus "dropping," or moving lower in your abdomen.  You may have a bloody mucus discharge. This usually occurs a few days to a week before labor begins.  Your cervix becomes thin and soft (effaced) near your due date. WHAT TO EXPECT AT YOUR  PRENATAL EXAMS  You will have prenatal exams every 2 weeks until week 36. Then, you will have weekly prenatal exams. During a routine prenatal visit:  You will be weighed to make sure you and the fetus are growing normally.  Your blood pressure is taken.  Your abdomen will be measured to track your baby's growth.  The fetal heartbeat will be listened to.  Any test results from the previous visit will be discussed.  You may have a cervical check near your due date to see if you have effaced. At around 36 weeks, your caregiver will check your cervix. At the same time, your caregiver will also perform a test on the secretions of the vaginal tissue. This test is to determine if a type of bacteria, Group B streptococcus, is present. Your caregiver will explain this further. Your caregiver may ask you:  What your birth plan is.  How you are feeling.  If you are feeling the baby move.  If you have had any abnormal symptoms, such as leaking fluid, bleeding, severe headaches, or abdominal cramping.  If you are using any tobacco products, including cigarettes, chewing tobacco, and electronic cigarettes.  If you have any questions. Other tests or screenings that may be performed during your third trimester include:  Blood tests that check for low iron levels (anemia).  Fetal testing to check the health, activity level, and growth of the fetus. Testing is done if you have certain medical conditions or if   there are problems during the pregnancy.  HIV (human immunodeficiency virus) testing. If you are at high risk, you may be screened for HIV during your third trimester of pregnancy. FALSE LABOR You may feel small, irregular contractions that eventually go away. These are called Braxton Hicks contractions, or false labor. Contractions may last for hours, days, or even weeks before true labor sets in. If contractions come at regular intervals, intensify, or become painful, it is best to be seen  by your caregiver.  SIGNS OF LABOR   Menstrual-like cramps.  Contractions that are 5 minutes apart or less.  Contractions that start on the top of the uterus and spread down to the lower abdomen and back.  A sense of increased pelvic pressure or back pain.  A watery or bloody mucus discharge that comes from the vagina. If you have any of these signs before the 37th week of pregnancy, call your caregiver right away. You need to go to the hospital to get checked immediately. HOME CARE INSTRUCTIONS   Avoid all smoking, herbs, alcohol, and unprescribed drugs. These chemicals affect the formation and growth of the baby.  Do not use any tobacco products, including cigarettes, chewing tobacco, and electronic cigarettes. If you need help quitting, ask your health care provider. You may receive counseling support and other resources to help you quit.  Follow your caregiver's instructions regarding medicine use. There are medicines that are either safe or unsafe to take during pregnancy.  Exercise only as directed by your caregiver. Experiencing uterine cramps is a good sign to stop exercising.  Continue to eat regular, healthy meals.  Wear a good support bra for breast tenderness.  Do not use hot tubs, steam rooms, or saunas.  Wear your seat belt at all times when driving.  Avoid raw meat, uncooked cheese, cat litter boxes, and soil used by cats. These carry germs that can cause birth defects in the baby.  Take your prenatal vitamins.  Take 1500-2000 mg of calcium daily starting at the 20th week of pregnancy until you deliver your baby.  Try taking a stool softener (if your caregiver approves) if you develop constipation. Eat more high-fiber foods, such as fresh vegetables or fruit and whole grains. Drink plenty of fluids to keep your urine clear or pale yellow.  Take warm sitz baths to soothe any pain or discomfort caused by hemorrhoids. Use hemorrhoid cream if your caregiver  approves.  If you develop varicose veins, wear support hose. Elevate your feet for 15 minutes, 3-4 times a day. Limit salt in your diet.  Avoid heavy lifting, wear low heal shoes, and practice good posture.  Rest a lot with your legs elevated if you have leg cramps or low back pain.  Visit your dentist if you have not gone during your pregnancy. Use a soft toothbrush to brush your teeth and be gentle when you floss.  A sexual relationship may be continued unless your caregiver directs you otherwise.  Do not travel far distances unless it is absolutely necessary and only with the approval of your caregiver.  Take prenatal classes to understand, practice, and ask questions about the labor and delivery.  Make a trial run to the hospital.  Pack your hospital bag.  Prepare the baby's nursery.  Continue to go to all your prenatal visits as directed by your caregiver. SEEK MEDICAL CARE IF:  You are unsure if you are in labor or if your water has broken.  You have dizziness.  You have   mild pelvic cramps, pelvic pressure, or nagging pain in your abdominal area.  You have persistent nausea, vomiting, or diarrhea.  You have a bad smelling vaginal discharge.  You have pain with urination. SEEK IMMEDIATE MEDICAL CARE IF:   You have a fever.  You are leaking fluid from your vagina.  You have spotting or bleeding from your vagina.  You have severe abdominal cramping or pain.  You have rapid weight loss or gain.  You have shortness of breath with chest pain.  You notice sudden or extreme swelling of your face, hands, ankles, feet, or legs.  You have not felt your baby move in over an hour.  You have severe headaches that do not go away with medicine.  You have vision changes.   This information is not intended to replace advice given to you by your health care provider. Make sure you discuss any questions you have with your health care provider.   Document Released:  06/21/2001 Document Revised: 07/18/2014 Document Reviewed: 08/28/2012 Elsevier Interactive Patient Education 2016 [employer]  Breastfeeding Deciding to breastfeed is one of the best choices you can make for you and your baby. A change in hormones during pregnancy causes your breast tissue to grow and increases the number and size of your milk ducts. These hormones also allow proteins, sugars, and fats from your blood supply to make breast milk in your milk-producing glands. Hormones prevent breast milk from being released before your baby is born as well as prompt milk flow after birth. Once breastfeeding has begun, thoughts of your baby, as well as his or her sucking or crying, can stimulate the release of milk from your milk-producing glands.  BENEFITS OF BREASTFEEDING For Your Baby  Your first milk (colostrum) helps your baby's digestive system function better.  There are antibodies in your milk that help your baby fight off infections.  Your baby has a lower incidence of asthma, allergies, and sudden infant death syndrome.  The nutrients in breast milk are better for your baby than infant formulas and are designed uniquely for your baby's needs.  Breast milk improves your baby's brain development.  Your baby is less likely to develop other conditions, such as childhood obesity, asthma, or type 2 diabetes mellitus. For You  Breastfeeding helps to create a very special bond between you and your baby.  Breastfeeding is convenient. Breast milk is always available at the correct temperature and costs nothing.  Breastfeeding helps to burn calories and helps you lose the weight gained during pregnancy.  Breastfeeding makes your uterus contract to its prepregnancy size faster and slows bleeding (lochia) after you give birth.   Breastfeeding helps to lower your risk of developing type 2 diabetes mellitus, osteoporosis, and breast or ovarian cancer later in life. SIGNS THAT YOUR BABY IS  HUNGRY Early Signs of Hunger  Increased alertness or activity.  Stretching.  Movement of the head from side to side.  Movement of the head and opening of the mouth when the corner of the mouth or cheek is stroked (rooting).  Increased sucking sounds, smacking lips, cooing, sighing, or squeaking.  Hand-to-mouth movements.  Increased sucking of fingers or hands. Late Signs of Hunger  Fussing.  Intermittent crying. Extreme Signs of Hunger Signs of extreme hunger will require calming and consoling before your baby will be able to breastfeed successfully. Do not wait for the following signs of extreme hunger to occur before you initiate breastfeeding:  Restlessness.  A loud, strong cry.  Screaming.   BREASTFEEDING BASICS Breastfeeding Initiation  Find a comfortable place to sit or lie down, with your neck and back well supported.  Place a pillow or rolled up blanket under your baby to bring him or her to the level of your breast (if you are seated). Nursing pillows are specially designed to help support your arms and your baby while you breastfeed.  Make sure that your baby's abdomen is facing your abdomen.  Gently massage your breast. With your fingertips, massage from your chest wall toward your nipple in a circular motion. This encourages milk flow. You may need to continue this action during the feeding if your milk flows slowly.  Support your breast with 4 fingers underneath and your thumb above your nipple. Make sure your fingers are well away from your nipple and your baby's mouth.  Stroke your baby's lips gently with your finger or nipple.  When your baby's mouth is open wide enough, quickly bring your baby to your breast, placing your entire nipple and as much of the colored area around your nipple (areola) as possible into your baby's mouth.  More areola should be visible above your baby's upper lip than below the lower lip.  Your baby's tongue should be between his  or her lower gum and your breast.  Ensure that your baby's mouth is correctly positioned around your nipple (latched). Your baby's lips should create a seal on your breast and be turned out (everted).  It is common for your baby to suck about 2-3 minutes in order to start the flow of breast milk. Latching Teaching your baby how to latch on to your breast properly is very important. An improper latch can cause nipple pain and decreased milk supply for you and poor weight gain in your baby. Also, if your baby is not latched onto your nipple properly, he or she may swallow some air during feeding. This can make your baby fussy. Burping your baby when you switch breasts during the feeding can help to get rid of the air. However, teaching your baby to latch on properly is still the best way to prevent fussiness from swallowing air while breastfeeding. Signs that your baby has successfully latched on to your nipple:  Silent tugging or silent sucking, without causing you pain.  Swallowing heard between every 3-4 sucks.  Muscle movement above and in front of his or her ears while sucking. Signs that your baby has not successfully latched on to nipple:  Sucking sounds or smacking sounds from your baby while breastfeeding.  Nipple pain. If you think your baby has not latched on correctly, slip your finger into the corner of your baby's mouth to break the suction and place it between your baby's gums. Attempt breastfeeding initiation again. Signs of Successful Breastfeeding Signs from your baby:  A gradual decrease in the number of sucks or complete cessation of sucking.  Falling asleep.  Relaxation of his or her body.  Retention of a small amount of milk in his or her mouth.  Letting go of your breast by himself or herself. Signs from you:  Breasts that have increased in firmness, weight, and size 1-3 hours after feeding.  Breasts that are softer immediately after  breastfeeding.  Increased milk volume, as well as a change in milk consistency and color by the fifth day of breastfeeding.  Nipples that are not sore, cracked, or bleeding. Signs That Your Baby is Getting Enough Milk  Wetting at least 3 diapers in a 24-hour period.   The urine should be clear and pale yellow by age 5 days.  At least 3 stools in a 24-hour period by age 5 days. The stool should be soft and yellow.  At least 3 stools in a 24-hour period by age 7 days. The stool should be seedy and yellow.  No loss of weight greater than 10% of birth weight during the first 3 days of age.  Average weight gain of 4-7 ounces (113-198 g) per week after age 4 days.  Consistent daily weight gain by age 5 days, without weight loss after the age of 2 weeks. After a feeding, your baby may spit up a small amount. This is common. BREASTFEEDING FREQUENCY AND DURATION Frequent feeding will help you make more milk and can prevent sore nipples and breast engorgement. Breastfeed when you feel the need to reduce the fullness of your breasts or when your baby shows signs of hunger. This is called "breastfeeding on demand." Avoid introducing a pacifier to your baby while you are working to establish breastfeeding (the first 4-6 weeks after your baby is born). After this time you may choose to use a pacifier. Research has shown that pacifier use during the first year of a baby's life decreases the risk of sudden infant death syndrome (SIDS). Allow your baby to feed on each breast as long as he or she wants. Breastfeed until your baby is finished feeding. When your baby unlatches or falls asleep while feeding from the first breast, offer the second breast. Because newborns are often sleepy in the first few weeks of life, you may need to awaken your baby to get him or her to feed. Breastfeeding times will vary from baby to baby. However, the following rules can serve as a guide to help you ensure that your baby is  properly fed:  Newborns (babies 4 weeks of age or younger) may breastfeed every 1-3 hours.  Newborns should not go longer than 3 hours during the day or 5 hours during the night without breastfeeding.  You should breastfeed your baby a minimum of 8 times in a 24-hour period until you begin to introduce solid foods to your baby at around 6 months of age. BREAST MILK PUMPING Pumping and storing breast milk allows you to ensure that your baby is exclusively fed your breast milk, even at times when you are unable to breastfeed. This is especially important if you are going back to work while you are still breastfeeding or when you are not able to be present during feedings. Your lactation consultant can give you guidelines on how long it is safe to store breast milk. A breast pump is a machine that allows you to pump milk from your breast into a sterile bottle. The pumped breast milk can then be stored in a refrigerator or freezer. Some breast pumps are operated by hand, while others use electricity. Ask your lactation consultant which type will work best for you. Breast pumps can be purchased, but some hospitals and breastfeeding support groups lease breast pumps on a monthly basis. A lactation consultant can teach you how to hand express breast milk, if you prefer not to use a pump. CARING FOR YOUR BREASTS WHILE YOU BREASTFEED Nipples can become dry, cracked, and sore while breastfeeding. The following recommendations can help keep your breasts moisturized and healthy:  Avoid using soap on your nipples.  Wear a supportive bra. Although not required, special nursing bras and tank tops are designed to allow access to your   breasts for breastfeeding without taking off your entire bra or top. Avoid wearing underwire-style bras or extremely tight bras.  Air dry your nipples for 3-4minutes after each feeding.  Use only cotton bra pads to absorb leaked breast milk. Leaking of breast milk between feedings  is normal.  Use lanolin on your nipples after breastfeeding. Lanolin helps to maintain your skin's normal moisture barrier. If you use pure lanolin, you do not need to wash it off before feeding your baby again. Pure lanolin is not toxic to your baby. You may also hand express a few drops of breast milk and gently massage that milk into your nipples and allow the milk to air dry. In the first few weeks after giving birth, some women experience extremely full breasts (engorgement). Engorgement can make your breasts feel heavy, warm, and tender to the touch. Engorgement peaks within 3-5 days after you give birth. The following recommendations can help ease engorgement:  Completely empty your breasts while breastfeeding or pumping. You may want to start by applying warm, moist heat (in the shower or with warm water-soaked hand towels) just before feeding or pumping. This increases circulation and helps the milk flow. If your baby does not completely empty your breasts while breastfeeding, pump any extra milk after he or she is finished.  Wear a snug bra (nursing or regular) or tank top for 1-2 days to signal your body to slightly decrease milk production.  Apply ice packs to your breasts, unless this is too uncomfortable for you.  Make sure that your baby is latched on and positioned properly while breastfeeding. If engorgement persists after 48 hours of following these recommendations, contact your health care provider or a lactation consultant. OVERALL HEALTH CARE RECOMMENDATIONS WHILE BREASTFEEDING  Eat healthy foods. Alternate between meals and snacks, eating 3 of each per day. Because what you eat affects your breast milk, some of the foods may make your baby more irritable than usual. Avoid eating these foods if you are sure that they are negatively affecting your baby.  Drink milk, fruit juice, and water to satisfy your thirst (about 10 glasses a day).  Rest often, relax, and continue to take  your prenatal vitamins to prevent fatigue, stress, and anemia.  Continue breast self-awareness checks.  Avoid chewing and smoking tobacco. Chemicals from cigarettes that pass into breast milk and exposure to secondhand smoke may harm your baby.  Avoid alcohol and drug use, including marijuana. Some medicines that may be harmful to your baby can pass through breast milk. It is important to ask your health care provider before taking any medicine, including all over-the-counter and prescription medicine as well as vitamin and herbal supplements. It is possible to become pregnant while breastfeeding. If birth control is desired, ask your health care provider about options that will be safe for your baby. SEEK MEDICAL CARE IF:  You feel like you want to stop breastfeeding or have become frustrated with breastfeeding.  You have painful breasts or nipples.  Your nipples are cracked or bleeding.  Your breasts are red, tender, or warm.  You have a swollen area on either breast.  You have a fever or chills.  You have nausea or vomiting.  You have drainage other than breast milk from your nipples.  Your breasts do not become full before feedings by the fifth day after you give birth.  You feel sad and depressed.  Your baby is too sleepy to eat well.  Your baby is having trouble sleeping.     Your baby is wetting less than 3 diapers in a 24-hour period.  Your baby has less than 3 stools in a 24-hour period.  Your baby's skin or the white part of his or her eyes becomes yellow.   Your baby is not gaining weight by 5 days of age. SEEK IMMEDIATE MEDICAL CARE IF:  Your baby is overly tired (lethargic) and does not want to wake up and feed.  Your baby develops an unexplained fever.   This information is not intended to replace advice given to you by your health care provider. Make sure you discuss any questions you have with your health care provider.   Document Released: 06/27/2005  Document Revised: 03/18/2015 Document Reviewed: 12/19/2012 Elsevier Interactive Patient Education 2016 [employer]  

## 2015-05-06 ENCOUNTER — Telehealth: Payer: Self-pay | Admitting: *Deleted

## 2015-05-06 NOTE — Telephone Encounter (Signed)
Informed pt of GBS result. Pt has no further questions at this time.

## 2015-05-06 NOTE — Telephone Encounter (Signed)
-----   Message from Paticia Stack, PA-C sent at 05/06/2015 12:47 PM EDT ----- Please notify pt of GBS status and label chart.   Thanks, KTC

## 2015-05-12 ENCOUNTER — Inpatient Hospital Stay (HOSPITAL_COMMUNITY): Payer: 59 | Admitting: Anesthesiology

## 2015-05-12 ENCOUNTER — Inpatient Hospital Stay (HOSPITAL_COMMUNITY)
Admission: AD | Admit: 2015-05-12 | Discharge: 2015-05-14 | DRG: 775 | Disposition: A | Discharge status: Home / Self Care | Payer: 59 | Source: Ambulatory Visit | Attending: Family Medicine | Admitting: Family Medicine

## 2015-05-12 ENCOUNTER — Encounter: Payer: 59 | Admitting: Family Medicine

## 2015-05-12 ENCOUNTER — Encounter (HOSPITAL_COMMUNITY): Payer: Self-pay | Admitting: *Deleted

## 2015-05-12 DIAGNOSIS — Z833 Family history of diabetes mellitus: Secondary | ICD-10-CM

## 2015-05-12 DIAGNOSIS — Z3A39 39 weeks gestation of pregnancy: Secondary | ICD-10-CM | POA: Diagnosis not present

## 2015-05-12 DIAGNOSIS — O9962 Diseases of the digestive system complicating childbirth: Secondary | ICD-10-CM | POA: Diagnosis present

## 2015-05-12 DIAGNOSIS — K219 Gastro-esophageal reflux disease without esophagitis: Secondary | ICD-10-CM | POA: Diagnosis present

## 2015-05-12 DIAGNOSIS — Z87891 Personal history of nicotine dependence: Secondary | ICD-10-CM

## 2015-05-12 DIAGNOSIS — IMO0001 Reserved for inherently not codable concepts without codable children: Secondary | ICD-10-CM

## 2015-05-12 DIAGNOSIS — Z885 Allergy status to narcotic agent status: Secondary | ICD-10-CM

## 2015-05-12 DIAGNOSIS — Z8711 Personal history of peptic ulcer disease: Secondary | ICD-10-CM

## 2015-05-12 DIAGNOSIS — O99824 Streptococcus B carrier state complicating childbirth: Secondary | ICD-10-CM | POA: Diagnosis present

## 2015-05-12 DIAGNOSIS — Z3493 Encounter for supervision of normal pregnancy, unspecified, third trimester: Secondary | ICD-10-CM

## 2015-05-12 DIAGNOSIS — O99820 Streptococcus B carrier state complicating pregnancy: Secondary | ICD-10-CM

## 2015-05-12 LAB — RPR: RPR Ser Ql: NONREACTIVE

## 2015-05-12 LAB — CBC
HCT: 36.1 % (ref 36.0–46.0)
Hemoglobin: 12.6 g/dL (ref 12.0–15.0)
MCH: 31 pg (ref 26.0–34.0)
MCHC: 34.9 g/dL (ref 30.0–36.0)
MCV: 88.7 fL (ref 78.0–100.0)
Platelets: 197 10*3/uL (ref 150–400)
RBC: 4.07 MIL/uL (ref 3.87–5.11)
RDW: 12.4 % (ref 11.5–15.5)
WBC: 9 10*3/uL (ref 4.0–10.5)

## 2015-05-12 LAB — ABO/RH: ABO/RH(D): B POS

## 2015-05-12 LAB — TYPE AND SCREEN
ABO/RH(D): B POS
Antibody Screen: NEGATIVE

## 2015-05-12 MED ORDER — SENNOSIDES-DOCUSATE SODIUM 8.6-50 MG PO TABS
2.0000 | ORAL_TABLET | ORAL | Status: DC
  Administered 2015-05-13 (×2): 2 via ORAL
  Filled 2015-05-12 (×2): qty 2

## 2015-05-12 MED ORDER — ONDANSETRON HCL 4 MG PO TABS: 4.0000 mg | ORAL_TABLET | ORAL | Status: DC | PRN

## 2015-05-12 MED ORDER — OXYTOCIN 40 UNITS IN LACTATED RINGERS INFUSION - SIMPLE MED: 62.5000 mL/h | INTRAVENOUS | Status: DC

## 2015-05-12 MED ORDER — LACTATED RINGERS IV SOLN
INTRAVENOUS | Status: DC
  Administered 2015-05-12: 05:00:00 via INTRAVENOUS

## 2015-05-12 MED ORDER — ACETAMINOPHEN 325 MG PO TABS
650.0000 mg | ORAL_TABLET | ORAL | Status: DC | PRN
Start: 2015-05-12 — End: 2015-05-14
  Administered 2015-05-12 – 2015-05-13 (×2): 650 mg via ORAL
  Filled 2015-05-12 (×2): qty 2

## 2015-05-12 MED ORDER — LIDOCAINE HCL (PF) 1 % IJ SOLN
INTRAMUSCULAR | Status: AC
  Administered 2015-05-12 (×2): 4 mL via EPIDURAL
  Filled 2015-05-12: qty 30

## 2015-05-12 MED ORDER — PHENYLEPHRINE 40 MCG/ML (10ML) SYRINGE FOR IV PUSH (FOR BLOOD PRESSURE SUPPORT)
80.0000 ug | PREFILLED_SYRINGE | INTRAVENOUS | Status: DC | PRN
  Filled 2015-05-12: qty 2

## 2015-05-12 MED ORDER — SIMETHICONE 80 MG PO CHEW
80.0000 mg | CHEWABLE_TABLET | ORAL | Status: DC | PRN
Start: 2015-05-12 — End: 2015-05-14

## 2015-05-12 MED ORDER — SODIUM CHLORIDE 0.9 % IV SOLN
2.0000 g | Freq: Once | INTRAVENOUS | Status: AC
  Administered 2015-05-12: 2 g via INTRAVENOUS
  Filled 2015-05-12: qty 2000

## 2015-05-12 MED ORDER — BENZOCAINE-MENTHOL 20-0.5 % EX AERO
1.0000 | INHALATION_SPRAY | CUTANEOUS | Status: DC | PRN
Start: 2015-05-12 — End: 2015-05-14
  Administered 2015-05-12: 1 via TOPICAL
  Filled 2015-05-12: qty 56

## 2015-05-12 MED ORDER — LIDOCAINE HCL (PF) 1 % IJ SOLN
30.0000 mL | INTRAMUSCULAR | Status: DC | PRN
  Filled 2015-05-12: qty 30

## 2015-05-12 MED ORDER — LACTATED RINGERS IV SOLN: INTRAVENOUS | Status: DC

## 2015-05-12 MED ORDER — LACTATED RINGERS IV SOLN: 500.0000 mL | INTRAVENOUS | Status: DC | PRN

## 2015-05-12 MED ORDER — DIPHENHYDRAMINE HCL 50 MG/ML IJ SOLN: 12.5000 mg | INTRAMUSCULAR | Status: DC | PRN

## 2015-05-12 MED ORDER — PRENATAL MULTIVITAMIN CH
1.0000 | ORAL_TABLET | Freq: Every day | ORAL | Status: DC
  Administered 2015-05-12 – 2015-05-14 (×3): 1 via ORAL
  Filled 2015-05-12 (×3): qty 1

## 2015-05-12 MED ORDER — ZOLPIDEM TARTRATE 5 MG PO TABS: 5.0000 mg | ORAL_TABLET | Freq: Every evening | ORAL | Status: DC | PRN

## 2015-05-12 MED ORDER — ACETAMINOPHEN 325 MG PO TABS: 650.0000 mg | ORAL_TABLET | ORAL | Status: DC | PRN

## 2015-05-12 MED ORDER — WITCH HAZEL-GLYCERIN EX PADS: 1.0000 "application " | MEDICATED_PAD | CUTANEOUS | Status: DC | PRN

## 2015-05-12 MED ORDER — IBUPROFEN 600 MG PO TABS
600.0000 mg | ORAL_TABLET | Freq: Four times a day (QID) | ORAL | Status: DC
  Administered 2015-05-12 – 2015-05-14 (×9): 600 mg via ORAL
  Filled 2015-05-12 (×9): qty 1

## 2015-05-12 MED ORDER — DIBUCAINE 1 % RE OINT: 1.0000 "application " | TOPICAL_OINTMENT | RECTAL | Status: DC | PRN

## 2015-05-12 MED ORDER — FLEET ENEMA 7-19 GM/118ML RE ENEM: 1.0000 | ENEMA | RECTAL | Status: DC | PRN

## 2015-05-12 MED ORDER — ONDANSETRON HCL 4 MG/2ML IJ SOLN: 4.0000 mg | INTRAMUSCULAR | Status: DC | PRN

## 2015-05-12 MED ORDER — PHENYLEPHRINE 40 MCG/ML (10ML) SYRINGE FOR IV PUSH (FOR BLOOD PRESSURE SUPPORT)
PREFILLED_SYRINGE | INTRAVENOUS | Status: AC
  Filled 2015-05-12: qty 10

## 2015-05-12 MED ORDER — DIPHENHYDRAMINE HCL 25 MG PO CAPS: 25.0000 mg | ORAL_CAPSULE | Freq: Four times a day (QID) | ORAL | Status: DC | PRN

## 2015-05-12 MED ORDER — SODIUM CHLORIDE 0.9 % IV SOLN
14.0000 mL/h | INTRAVENOUS | Status: DC | PRN
  Filled 2015-05-12 (×2): qty 17

## 2015-05-12 MED ORDER — ONDANSETRON HCL 4 MG/2ML IJ SOLN
4.0000 mg | Freq: Four times a day (QID) | INTRAMUSCULAR | Status: DC | PRN
  Administered 2015-05-12: 4 mg via INTRAVENOUS
  Filled 2015-05-12: qty 2

## 2015-05-12 MED ORDER — EPHEDRINE 5 MG/ML INJ
10.0000 mg | INTRAVENOUS | Status: DC | PRN
  Filled 2015-05-12: qty 2

## 2015-05-12 MED ORDER — CITRIC ACID-SODIUM CITRATE 334-500 MG/5ML PO SOLN: 30.0000 mL | ORAL | Status: DC | PRN

## 2015-05-12 MED ORDER — LANOLIN HYDROUS EX OINT: TOPICAL_OINTMENT | CUTANEOUS | Status: DC | PRN

## 2015-05-12 MED ORDER — OXYTOCIN BOLUS FROM INFUSION
500.0000 mL | INTRAVENOUS | Status: DC
  Administered 2015-05-12: 500 mL via INTRAVENOUS

## 2015-05-12 MED ORDER — OXYTOCIN 40 UNITS IN LACTATED RINGERS INFUSION - SIMPLE MED
INTRAVENOUS | Status: AC
  Filled 2015-05-12: qty 1000

## 2015-05-12 MED ORDER — FENTANYL 2.5 MCG/ML BUPIVACAINE 1/10 % EPIDURAL INFUSION (WH - ANES)
INTRAMUSCULAR | Status: AC
  Administered 2015-05-12: 14 mL/h via EPIDURAL
  Filled 2015-05-12: qty 125

## 2015-05-12 MED ORDER — TETANUS-DIPHTH-ACELL PERTUSSIS 5-2.5-18.5 LF-MCG/0.5 IM SUSP: 0.5000 mL | Freq: Once | INTRAMUSCULAR | Status: DC

## 2015-05-12 MED ORDER — SODIUM CHLORIDE 0.9 % IV SOLN
1.0000 g | Freq: Four times a day (QID) | INTRAVENOUS | Status: DC
  Filled 2015-05-12 (×2): qty 1000

## 2015-05-12 NOTE — Anesthesia Procedure Notes (Signed)
Epidural Patient location during procedure: OB Start time: 05/12/2015 5:06 AM  Staffing Anesthesiologist: Josephine Igo  Preanesthetic Checklist Completed: patient identified, site marked, surgical consent, pre-op evaluation, timeout performed, IV checked, risks and benefits discussed and monitors and equipment checked  Epidural Patient position: left lateral decubitus Prep: site prepped and draped and DuraPrep Patient monitoring: continuous pulse ox and blood pressure Approach: midline Location: L3-L4 Injection technique: LOR air  Needle:  Needle type: Tuohy  Needle gauge: 17 G Needle length: 9 cm and 9 Needle insertion depth: 4 cm Catheter type: closed end flexible Catheter size: 19 Gauge Catheter at skin depth: 9 cm Test dose: negative and Other  Assessment Events: blood not aspirated, injection not painful, no injection resistance, negative IV test and no paresthesia  Additional Notes Patient identified. Risks and benefits discussed including failed block, incomplete  Pain control, post dural puncture headache, nerve damage, paralysis, blood pressure Changes, nausea, vomiting, reactions to medications-both toxic and allergic and post Partum back pain. All questions were answered. Patient expressed understanding and wished to proceed. Sterile technique was used throughout procedure. Epidural site was Dressed with sterile barrier dressing. No paresthesias, signs of intravascular injection Or signs of intrathecal spread were encountered.  Patient was more comfortable after the epidural was dosed. Please see RN's note for documentation of vital signs and FHR which are stable.

## 2015-05-12 NOTE — Anesthesia Preprocedure Evaluation (Addendum)
Anesthesia Evaluation  Patient identified by MRN, date of birth, ID band Patient awake    Reviewed: Allergy & Precautions, Patient's Chart, lab work & pertinent test results  Airway Mallampati: II  TM Distance: >3 FB Neck ROM: Full    Dental no notable dental hx. (+) Teeth Intact   Pulmonary asthma , former smoker,    Pulmonary exam normal breath sounds clear to auscultation       Cardiovascular negative cardio ROS Normal cardiovascular exam Rhythm:Regular Rate:Normal     Neuro/Psych PSYCHIATRIC DISORDERS Anxiety Depression ADHDnegative neurological ROS     GI/Hepatic Neg liver ROS, PUD, GERD  ,  Endo/Other  negative endocrine ROS  Renal/GU negative Renal ROS  negative genitourinary   Musculoskeletal negative musculoskeletal ROS (+)   Abdominal   Peds  Hematology negative hematology ROS (+)   Anesthesia Other Findings   Reproductive/Obstetrics (+) Pregnancy                            Anesthesia Physical Anesthesia Plan  ASA: II  Anesthesia Plan: Epidural   Post-op Pain Management:    Induction:   Airway Management Planned: Natural Airway  Additional Equipment:   Intra-op Plan:   Post-operative Plan:   Informed Consent: I have reviewed the patients History and Physical, chart, labs and discussed the procedure including the risks, benefits and alternatives for the proposed anesthesia with the patient or authorized representative who has indicated his/her understanding and acceptance.     Plan Discussed with: Anesthesiologist  Anesthesia Plan Comments:         Anesthesia Quick Evaluation

## 2015-05-12 NOTE — Lactation Note (Signed)
This note was copied from the chart of [place]. Lactation Consultation Note  Baby 61 hours old and sleeping.  P2, Ex BF for 2 years. Mother states baby has been sleepy.  Encouraged STS. Discussed hand expression, cluster feeding. Mom made aware of O/P services, breastfeeding support groups, community resources, and our phone # for post-discharge questions.  Mom encouraged to feed baby 8-12 times/24 hours and with feeding cues.  Suggest she call if needs assistance wl latching.   Patient Name: Joanna Morgan [id number] Date: 05/12/2015 Reason for consult: Initial assessment   Maternal Data    Feeding    LATCH Score/Interventions                      Lactation Tools Discussed/Used     Consult Status Consult Status: Follow-up Date: 05/13/15 Follow-up type: In-patient    Vivianne Master [facility] 05/12/2015, 2:37 PM

## 2015-05-12 NOTE — MAU Note (Signed)
Contractions started last night, getting closer together but pt has not been timing. Denies LOF or vag bleeding. +FM

## 2015-05-12 NOTE — Anesthesia Postprocedure Evaluation (Signed)
Anesthesia Post Note  Patient: Joanna Morgan  Procedure(s) Performed: * No procedures listed *  Anesthesia type: Epidural  Patient location: Mother/Baby  Post pain: Pain level controlled  Post assessment: Post-op Vital signs reviewed  Last Vitals:  Filed Vitals:   05/12/15 0925  BP: 92/65  Pulse: 58  Temp: 36.9 C  Resp: 18    Post vital signs: Reviewed  Level of consciousness:alert  Complications: No apparent anesthesia complications

## 2015-05-12 NOTE — H&P (Signed)
Joanna Morgan is a 29 y.o. female presenting for active Labor. Maternal Medical History:  Reason for admission: Contractions and nausea.   Contractions: Onset was 3-5 hours ago.   Frequency: regular.   Perceived severity is strong.    Fetal activity: Perceived fetal activity is normal.   Last perceived fetal movement was within the past hour.    Prenatal complications: No bleeding, PIH, placental abnormality or preterm labor.   Prenatal Complications - Diabetes: none.    OB History    Gravida Para Term Preterm AB TAB SAB Ectopic Multiple Living   2 1 1  0 0 0 0 0 0 1     Past Medical History  Diagnosis Date  . Anxiety   . ADHD (attention deficit hyperactivity disorder)   . Depression   . Abnormal Pap smear 04/26/11    LSIL HPV/MILD DYSPLASIA CIN1  . Stomach ulcer    Past Surgical History  Procedure Laterality Date  . No past surgeries    . Wisdom tooth extraction     Family History: family history includes Asthma in her sister; Cancer in her maternal grandfather, maternal grandmother, maternal uncle, and mother; Depression in her mother; Diabetes in her paternal grandmother; Schizophrenia in her father. Social History:  reports that she quit smoking about 4 years ago. She has never used smokeless tobacco. She reports that she does not drink alcohol or use illicit drugs.   Prenatal Transfer Tool  Maternal Diabetes: No Genetic Screening: Normal Maternal Ultrasounds/Referrals: Normal Fetal Ultrasounds or other Referrals:  None Maternal Substance Abuse:  No Significant Maternal Medications:  None Significant Maternal Lab Results:  Lab values include: Group B Strep positive Other Comments:  None  Review of Systems  Constitutional: Negative for fever, chills and malaise/fatigue.  Gastrointestinal: Positive for nausea, vomiting and abdominal pain. Negative for diarrhea and constipation.  Musculoskeletal: Positive for back pain.  Neurological: Negative for dizziness,  focal weakness, weakness and headaches.    Dilation: 4 Effacement (%): 80 Station: -2 Exam by:: Joelene Millin Whitehurst-Boyd RN Blood pressure 112/70, pulse 79, temperature 97.7 F (36.5 C), temperature source Oral, resp. rate 18, height 5' 6.5" (1.689 m), weight 68.04 kg (150 lb), last menstrual period 08/11/2014. Maternal Exam:  Uterine Assessment: Contraction strength is firm.  Contraction frequency is regular.   Abdomen: Patient reports no abdominal tenderness. Fundal height is 40.   Estimated fetal weight is 7.   Fetal presentation: vertex  Introitus: Normal vulva. Normal vagina.  Vagina is negative for discharge.  Ferning test: not done.  Nitrazine test: not done. Amniotic fluid character: not assessed.  Pelvis: adequate for delivery.   Cervix: Cervix evaluated by digital exam.     Fetal Exam Fetal Monitor Review: Mode: ultrasound.   Baseline rate: 140.  Variability: moderate (6-25 bpm).   Pattern: accelerations present and no decelerations.    Fetal State Assessment: Category I - tracings are normal.     Physical Exam  Constitutional: She is oriented to person, place, and time. She appears well-developed and well-nourished. No distress.  HENT:  Head: Normocephalic.  Cardiovascular: Normal rate, regular rhythm and normal heart sounds.  Exam reveals no gallop and no friction rub.   No murmur heard. Respiratory: Effort normal and breath sounds normal. No respiratory distress. She has no wheezes. She has no rales.  GI: Soft. She exhibits no distension. There is no tenderness. There is no rebound and no guarding.  Genitourinary: Vagina normal. No vaginal discharge found.  Dilation: 4 Effacement (%): 80  Cervical Position: Middle Station: -2 Presentation: Vertex Exam by:: Joelene Millin Whitehurst-Boyd RN   Musculoskeletal: Normal range of motion.  Neurological: She is alert and oriented to person, place, and time.  Skin: Skin is warm. She is diaphoretic.  Psychiatric:  She has a normal mood and affect.    Prenatal labs: ABO, Rh: B/Positive/-- (03/14 0000) Antibody: Negative (03/14 0000) Rubella: Immune (03/14 0000) RPR: NON REAC (08/17 0921)  HBsAg: Negative (03/14 0000)  HIV: NONREACTIVE (08/17 0921)  GBS: Positive (10/18 0000)   Assessment/Plan: A:  SIUP at [redacted]w[redacted]d        Active Labor       Category I NST  P;  Admit to [facility]       Routine orders       Epidural PRN        Ampicillin for GBS   [facility] 05/12/2015, 4:45 AM

## 2015-05-13 LAB — CBC
HCT: 33.5 % — ABNORMAL LOW (ref 36.0–46.0)
Hemoglobin: 11.3 g/dL — ABNORMAL LOW (ref 12.0–15.0)
MCH: 30.6 pg (ref 26.0–34.0)
MCHC: 33.7 g/dL (ref 30.0–36.0)
MCV: 90.8 fL (ref 78.0–100.0)
Platelets: 146 10*3/uL — ABNORMAL LOW (ref 150–400)
RBC: 3.69 MIL/uL — ABNORMAL LOW (ref 3.87–5.11)
RDW: 12.7 % (ref 11.5–15.5)
WBC: 8.4 10*3/uL (ref 4.0–10.5)

## 2015-05-13 MED ORDER — HYDROMORPHONE HCL 2 MG PO TABS
2.0000 mg | ORAL_TABLET | ORAL | Status: DC | PRN
  Administered 2015-05-13 – 2015-05-14 (×4): 2 mg via ORAL
  Filled 2015-05-13 (×4): qty 1

## 2015-05-13 NOTE — Progress Notes (Signed)
[name] was referred for history of depression/anxiety.  Referral is screened out by Clinical Social Worker because none of the following criteria appear to apply: -History of anxiety/depression during this pregnancy, or of post-partum depression. - Diagnosis of anxiety and/or depression within last 3 years or -[name]'s symptoms are currently being treated with medication and/or therapy.  Per chart review, [name] presents with history of chronic depression and anxiety.  Depression and anxiety initially noted in [name]'s chart in 2008.  CSW in 2012 met with [name] after first child was born, and reported that symptoms occurred in high school, and she has since learned how to cope with symptoms.  No acute concerns noted during this pregnancy.   Please contact the Clinical Social Worker if needs arise or upon [name] request.   Joanna Morgan MSW, LCSW [phone]

## 2015-05-13 NOTE — Progress Notes (Signed)
Post Partum Day 1 Subjective:  Joanna Morgan is a 29 y.o. [id number] [redacted]w[redacted]d s/p SVD.  No acute events overnight.  Pt denies problems with ambulating, voiding or po intake.  She denies nausea or vomiting.  Pain is moderately controlled.  She has not had flatus.  Lochia Moderate but decreasing.  Plan for birth control is oral contraceptives (estrogen/progesterone).  Method of Feeding: breast  Objective: Blood pressure 90/54, pulse 62, temperature 98.2 F (36.8 C), temperature source Oral, resp. rate 16, height 5\' 7"  (1.702 m), weight 68.04 kg (150 lb), last menstrual period 08/11/2014, SpO2 99 %, unknown if currently breastfeeding.  Physical Exam:  General: alert, cooperative and no distress Chest: normal WOB Heart: Regular rate Abdomen: +BS, soft, mild TTP (appropriate) Uterine Fundus: firm, below the umbilicus, and moderately tender to palpation  DVT Evaluation: No evidence of DVT seen on physical exam. Extremities: Normal. Good pulses. No edema    Recent Labs  05/12/15 0435 05/13/15 0550  HGB 12.6 11.3*  HCT 36.1 33.5*    Assessment/Plan:  ASSESSMENT: Joanna Morgan is a 29 y.o. [id number] [redacted]w[redacted]d s/p SVD. No signs of infection or decompensation. Patient is doing well.   Plan for discharge tomorrow Continue routine PP care Breastfeeding support PRN Monitor BP encourages fluids  LOS: 1 day   JohannesL [place] 05/13/2015, 6:33 AM   Seen by me also. See my note for official documentation. Seabron Spates, CNM

## 2015-05-13 NOTE — Progress Notes (Signed)
Post Partum Day 1 Subjective: up ad lib, voiding, tolerating PO and having a lot of pain with uterine cramping.  ibuprofen and Tylenol not helping  Objective: Blood pressure 90/54, pulse 62, temperature 98.2 F (36.8 C), temperature source Oral, resp. rate 16, height 5\' 7"  (1.702 m), weight 68.04 kg (150 lb), last menstrual period 08/11/2014, SpO2 99 %, unknown if currently breastfeeding.  Physical Exam:  General: alert, cooperative and no distress Lochia: appropriate Uterine Fundus: firm Incision: healing well, no significant drainage DVT Evaluation: No evidence of DVT seen on physical exam.   Recent Labs  05/12/15 0435 05/13/15 0550  HGB 12.6 11.3*  HCT 36.1 33.5*    Assessment/Plan: Plan for discharge tomorrow and Breastfeeding Will try some Dilaudid, as she is allergic to Codeine   LOS: 1 day   [facility] 05/13/2015, 6:37 AM

## 2015-05-13 NOTE — Lactation Note (Signed)
This note was copied from the chart of [place]. Lactation Consultation Note Follow up visit at 35 hours of age.  Baby has had 7 feedings with 4voids and 2 stools.  Last stool was >18 hours ago. Baby had circ today and has been sleepy, but last feeding was about 1 hours ago with baby doing well for 15 minutes per moms report.  Baby asleep in moms arms now.   Mom reports she is seeing colostrum with hand expression at feedings, but is having to work on keeping baby on deeply.  Discussed positioning baby in cross cradle or football and waiting for wide open mouth, mom has been latching in cradle hold.  Encouraged mom to consider spoon feeding with EBM as an appetizer or desert this evening until baby has anther stool.  Discussed cluster feedings as well.  Mom to call for assist as needed.    Patient Name: Joanna Morgan [id number] Date: 05/13/2015 Reason for consult: Follow-up assessment   Maternal Data    Feeding Feeding Type: Breast Fed Length of feed: 15 min  LATCH Score/Interventions Latch:  (instructed mother to call for next latch)              Intervention(s): Breastfeeding basics reviewed     Lactation Tools Discussed/Used     Consult Status Consult Status: Follow-up Date: 05/14/15 Follow-up type: In-patient    Joanna Morgan 05/13/2015, 5:42 PM

## 2015-05-14 MED ORDER — IBUPROFEN 600 MG PO TABS: 600.0000 mg | ORAL_TABLET | Freq: Four times a day (QID) | ORAL | Status: DC

## 2015-05-14 MED ORDER — HYDROCORTISONE ACE-PRAMOXINE 1-1 % RE FOAM: 1.0000 | Freq: Two times a day (BID) | RECTAL | Status: DC

## 2015-05-14 NOTE — Lactation Note (Signed)
This note was copied from the chart of [place]. Lactation Consultation Note; Experienced BF mom has baby latched to the breast when I went into room but not latched deeply. Suggested relatching to get deeper onto breast. Mom agreeable. Dad changed diaper and after a few attempts baby latched well with wide open mouth. Lots of audible swallows noted. Mom reports breasts started feeling heavier this morning. Nipples slightly pink and mom reports they are tender.Comfort gels given with instructions for use. Mom reports using them with last baby. No questions at present. Reviewed OP appointments and BFSG as resources after DC. To call prn  Patient Name: Joanna Morgan [id number] Date: 05/14/2015 Reason for consult: Follow-up assessment   Maternal Data Formula Feeding for Exclusion: No Has patient been taught Hand Expression?: Yes Does the patient have breastfeeding experience prior to this delivery?: Yes  Feeding Feeding Type: Breast Fed Length of feed: 20 min  LATCH Score/Interventions Latch: Grasps breast easily, tongue down, lips flanged, rhythmical sucking.  Audible Swallowing: Spontaneous and intermittent  Type of Nipple: Everted at rest and after stimulation  Comfort (Breast/Nipple): Filling, red/small blisters or bruises, mild/mod discomfort  Problem noted: Mild/Moderate discomfort Interventions (Mild/moderate discomfort): Hand expression;Comfort gels  Hold (Positioning): Assistance needed to correctly position infant at breast and maintain latch. Intervention(s): Breastfeeding basics reviewed  LATCH Score: 8  Lactation Tools Discussed/Used WIC Program: No   Consult Status Consult Status: Complete    Truddie Crumble 05/14/2015, 10:30 AM

## 2015-05-14 NOTE — Discharge Instructions (Signed)

## 2015-05-14 NOTE — Discharge Summary (Signed)
OB Discharge Summary  Patient Name: Joanna Morgan DOB: Aug 30, 1985 MRN: [id number]  Date of admission: 05/12/2015 Delivering MD: Seabron Spates   Date of discharge: 05/14/2015  Admitting diagnosis: 73 WEEKS CTX Intrauterine pregnancy: [redacted]w[redacted]d     Secondary diagnosis:Active Problems:   Normal labor   Active labor at term   NSVD (normal spontaneous vaginal delivery)   First degree perineal laceration during delivery  Additional problems: GBS positive, received adequate treatment     Discharge diagnosis: Term Pregnancy Delivered                                                                     Post partum procedures:None  Augmentation: None  Complications: None  Hospital course:  Onset of Labor With Vaginal Delivery     29 y.o. yo G3T5176 at [redacted]w[redacted]d was admitted in Active Laboron 05/12/2015. Patient had an uncomplicated labor course as follows:  Membrane Rupture Time/Date: 4:52 AM ,05/12/2015   Intrapartum Procedures: Episiotomy: None [1]                                         Lacerations:  1st degree [2];Perineal [11]  Patient had a delivery of a Viable infant. 05/12/2015  Information for the patient's newborn:  Philis, Doke [[id number]]  Delivery Method: Vaginal, Spontaneous Delivery (Filed from Delivery Summary)     Pateint had an uncomplicated postpartum course. However, she did require dilaudid during hospitalization for uterine cramping that did not respond to ibuprofen or tylenol. She is ambulating, tolerating a regular diet, passing flatus, and urinating well. Patient is discharged home in stable condition on 05/14/15.   Physical exam  Filed Vitals:   05/12/15 2131 05/13/15 0547 05/13/15 1710 05/14/15 0605  BP: 93/58 90/54 96/57  92/59  Pulse: 66 62 61 58  Temp: 98 F (36.7 C) 98.2 F (36.8 C) 97.8 F (36.6 C) 97.7 F (36.5 C)  TempSrc: Oral Oral Oral   Resp: 18 16 18 16   Height:      Weight:      SpO2:       General: alert, cooperative and no  distress Lochia: appropriate Uterine Fundus: firm Incision: N/A DVT Evaluation: No evidence of DVT seen on physical exam. Negative Homan's sign. Labs: Lab Results  Component Value Date   WBC 8.4 05/13/2015   HGB 11.3* 05/13/2015   HCT 33.5* 05/13/2015   MCV 90.8 05/13/2015   PLT 146* 05/13/2015   CMP Latest Ref Rng 04/18/2007  Glucose 70-99 mg/dL 74  BUN 6-23 mg/dL 2(L)  Creatinine 0.4-1.2 mg/dL 0.7  Sodium 135-145 meq/L 143  Potassium 3.5-5.1 meq/L 5.0  Chloride 96-112 meq/L 107  CO2 19-32 meq/L 31  Calcium 8.4-10.5 mg/dL 9.5  Total Protein 6.0-8.3 g/dL 6.8  Total Bilirubin 0.3-1.2 mg/dL 0.7  Alkaline Phos 39-117 units/L 54  AST 0-37 units/L 14  ALT 0-35 units/L 15    Discharge instruction: per After Visit Summary and "Baby and Me Booklet".  Medications:  Current facility-administered medications:  .  acetaminophen (TYLENOL) tablet 650 mg, 650 mg, Oral, Q4H PRN, Seabron Spates, CNM, 650 mg at 05/13/15 2694 .  benzocaine-Menthol (DERMOPLAST) 20-0.5 % topical spray 1 application, 1 application, Topical, PRN, Seabron Spates, CNM, 1 application at 81/27/51 1329 .  witch hazel-glycerin (TUCKS) pad 1 application, 1 application, Topical, PRN **AND** dibucaine (NUPERCAINAL) 1 % rectal ointment 1 application, 1 application, Rectal, PRN, Seabron Spates, CNM .  diphenhydrAMINE (BENADRYL) capsule 25 mg, 25 mg, Oral, Q6H PRN, Seabron Spates, CNM .  HYDROmorphone (DILAUDID) tablet 2 mg, 2 mg, Oral, Q3H PRN, Seabron Spates, CNM, 2 mg at 05/14/15 0550 .  ibuprofen (ADVIL,MOTRIN) tablet 600 mg, 600 mg, Oral, 4 times per day, Seabron Spates, CNM, 600 mg at 05/14/15 0550 .  lanolin ointment, , Topical, PRN, Seabron Spates, CNM .  ondansetron (ZOFRAN) tablet 4 mg, 4 mg, Oral, Q4H PRN **OR** ondansetron (ZOFRAN) injection 4 mg, 4 mg, Intravenous, Q4H PRN, Seabron Spates, CNM .  prenatal multivitamin tablet 1 tablet, 1 tablet, Oral, Q1200, Seabron Spates, CNM, 1 tablet at  05/13/15 1120 .  senna-docusate (Senokot-S) tablet 2 tablet, 2 tablet, Oral, Q24H, Seabron Spates, CNM, 2 tablet at 05/13/15 2334 .  simethicone (MYLICON) chewable tablet 80 mg, 80 mg, Oral, PRN, Seabron Spates, CNM .  zolpidem (AMBIEN) tablet 5 mg, 5 mg, Oral, QHS PRN, Seabron Spates, CNM After Visit Meds:    Medication List    TAKE these medications        ibuprofen 600 MG tablet  Commonly known as:  ADVIL,MOTRIN  Take 1 tablet (600 mg total) by mouth every 6 (six) hours.     multivitamin-prenatal 27-0.8 MG Tabs tablet  Take 1 tablet by mouth daily at 12 noon.        Diet: routine diet  Activity: Advance as tolerated. Pelvic rest for 6 weeks.   Outpatient follow up:6 weeks Follow up Appt:No future appointments. Follow up visit: No Follow-up on file.  Postpartum contraception: OCPs  Newborn Data: Live born female  Birth Weight: 7 lb 7.8 oz (3396 g) APGAR: 8, 9  Baby Feeding: Breast Disposition:home with mother  05/14/2015 Darci Needle, MD  [place], PGY-1  CNM attestation I have seen and examined this patient and agree with above documentation in the resident's note.   Joanna Morgan is a 29 y.o. [id number] s/p SVD.   Pain is well controlled.  Plan for birth control is oral progesterone-only contraceptive.  Method of Feeding: breast  PE:  BP 92/59 mmHg  Pulse 58  Temp(Src) 97.7 F (36.5 C) (Oral)  Resp 16  Ht 5\' 7"  (1.702 m)  Wt 68.04 kg (150 lb)  BMI 23.49 kg/m2  SpO2 99%  LMP 08/11/2014  Breastfeeding? Unknown Fundus firm   Recent Labs  05/12/15 0435 05/13/15 0550  HGB 12.6 11.3*  HCT 36.1 33.5*     Plan: discharge today - postpartum care discussed - f/u clinic in 6 weeks for postpartum visit   Serita Grammes, CNM 7:34 AM  05/14/2015

## 2015-06-25 ENCOUNTER — Encounter: Payer: Self-pay | Admitting: Obstetrics and Gynecology

## 2015-06-25 ENCOUNTER — Ambulatory Visit (INDEPENDENT_AMBULATORY_CARE_PROVIDER_SITE_OTHER): Payer: 59 | Admitting: Obstetrics and Gynecology

## 2015-06-25 DIAGNOSIS — Z30011 Encounter for initial prescription of contraceptive pills: Secondary | ICD-10-CM

## 2015-06-25 MED ORDER — NORGESTIMATE-ETH ESTRADIOL 0.25-35 MG-MCG PO TABS: 1.0000 | ORAL_TABLET | Freq: Every day | ORAL | Status: DC

## 2015-06-25 NOTE — Patient Instructions (Signed)
Contraception Choices Contraception (birth control) is the use of any methods or devices to prevent pregnancy. Below are some methods to help avoid pregnancy. HORMONAL METHODS   Contraceptive implant. This is a thin, plastic tube containing progesterone hormone. It does not contain estrogen hormone. Your health care provider inserts the tube in the inner part of the upper arm. The tube can remain in place for up to 3 years. After 3 years, the implant must be removed. The implant prevents the ovaries from releasing an egg (ovulation), thickens the cervical mucus to prevent sperm from entering the uterus, and thins the lining of the inside of the uterus.  Progesterone-only injections. These injections are given every 3 months by your health care provider to prevent pregnancy. This synthetic progesterone hormone stops the ovaries from releasing eggs. It also thickens cervical mucus and changes the uterine lining. This makes it harder for sperm to survive in the uterus.  Birth control pills. These pills contain estrogen and progesterone hormone. They work by preventing the ovaries from releasing eggs (ovulation). They also cause the cervical mucus to thicken, preventing the sperm from entering the uterus. Birth control pills are prescribed by a health care provider.Birth control pills can also be used to treat heavy periods.  Minipill. This type of birth control pill contains only the progesterone hormone. They are taken every day of each month and must be prescribed by your health care provider.  Birth control patch. The patch contains hormones similar to those in birth control pills. It must be changed once a week and is prescribed by a health care provider.  Vaginal ring. The ring contains hormones similar to those in birth control pills. It is left in the vagina for 3 weeks, removed for 1 week, and then a new one is put back in place. The patient must be comfortable inserting and removing the ring  from the vagina.A health care provider's prescription is necessary.  Emergency contraception. Emergency contraceptives prevent pregnancy after unprotected sexual intercourse. This pill can be taken right after sex or up to 5 days after unprotected sex. It is most effective the sooner you take the pills after having sexual intercourse. Most emergency contraceptive pills are available without a prescription. Check with your pharmacist. Do not use emergency contraception as your only form of birth control. BARRIER METHODS   Female condom. This is a thin sheath (latex or rubber) that is worn over the penis during sexual intercourse. It can be used with spermicide to increase effectiveness.  Female condom. This is a soft, loose-fitting sheath that is put into the vagina before sexual intercourse.  Diaphragm. This is a soft, latex, dome-shaped barrier that must be fitted by a health care provider. It is inserted into the vagina, along with a spermicidal jelly. It is inserted before intercourse. The diaphragm should be left in the vagina for 6 to 8 hours after intercourse.  Cervical cap. This is a round, soft, latex or plastic cup that fits over the cervix and must be fitted by a health care provider. The cap can be left in place for up to 48 hours after intercourse.  Sponge. This is a soft, circular piece of polyurethane foam. The sponge has spermicide in it. It is inserted into the vagina after wetting it and before sexual intercourse.  Spermicides. These are chemicals that kill or block sperm from entering the cervix and uterus. They come in the form of creams, jellies, suppositories, foam, or tablets. They do not require a   prescription. They are inserted into the vagina with an applicator before having sexual intercourse. The process must be repeated every time you have sexual intercourse. INTRAUTERINE CONTRACEPTION  Intrauterine device (IUD). This is a T-shaped device that is put in a woman's uterus  during a menstrual period to prevent pregnancy. There are 2 types:  Copper IUD. This type of IUD is wrapped in copper wire and is placed inside the uterus. Copper makes the uterus and fallopian tubes produce a fluid that kills sperm. It can stay in place for 10 years.  Hormone IUD. This type of IUD contains the hormone progestin (synthetic progesterone). The hormone thickens the cervical mucus and prevents sperm from entering the uterus, and it also thins the uterine lining to prevent implantation of a fertilized egg. The hormone can weaken or kill the sperm that get into the uterus. It can stay in place for 3-5 years, depending on which type of IUD is used. PERMANENT METHODS OF CONTRACEPTION  Female tubal ligation. This is when the woman's fallopian tubes are surgically sealed, tied, or blocked to prevent the egg from traveling to the uterus.  Hysteroscopic sterilization. This involves placing a small coil or insert into each fallopian tube. Your doctor uses a technique called hysteroscopy to do the procedure. The device causes scar tissue to form. This results in permanent blockage of the fallopian tubes, so the sperm cannot fertilize the egg. It takes about 3 months after the procedure for the tubes to become blocked. You must use another form of birth control for these 3 months.  Female sterilization. This is when the female has the tubes that carry sperm tied off (vasectomy).This blocks sperm from entering the vagina during sexual intercourse. After the procedure, the man can still ejaculate fluid (semen). NATURAL PLANNING METHODS  Natural family planning. This is not having sexual intercourse or using a barrier method (condom, diaphragm, cervical cap) on days the woman could become pregnant.  Calendar method. This is keeping track of the length of each menstrual cycle and identifying when you are fertile.  Ovulation method. This is avoiding sexual intercourse during ovulation.  Symptothermal  method. This is avoiding sexual intercourse during ovulation, using a thermometer and ovulation symptoms.  Post-ovulation method. This is timing sexual intercourse after you have ovulated. Regardless of which type or method of contraception you choose, it is important that you use condoms to protect against the transmission of sexually transmitted infections (STIs). Talk with your health care provider about which form of contraception is most appropriate for you.   This information is not intended to replace advice given to you by your health care provider. Make sure you discuss any questions you have with your health care provider.   Document Released: 06/27/2005 Document Revised: 07/02/2013 Document Reviewed: 12/20/2012 Elsevier Interactive Patient Education 2016 [employer]  

## 2015-06-25 NOTE — Progress Notes (Signed)
  Subjective:     Joanna Morgan is a 29 y.o. female who presents for a postpartum visit. She is 5 weeks postpartum following a spontaneous vaginal delivery. I have fully reviewed the prenatal and intrapartum course. The delivery was at 39.1 gestational weeks. Outcome: spontaneous vaginal delivery. Anesthesia: epidural. Postpartum course has been uncomplicated. Baby's course has been uncomplicated. Baby is feeding by breast. Bleeding no bleeding. Bowel function is normal. Bladder function is normal. Patient is not sexually active. Contraception method is abstinence. Postpartum depression screening: negative.     Review of Systems Pertinent items are noted in HPI.   Objective:    BP 99/64 mmHg  Pulse 97  Ht 5\' 7"  (1.702 m)  Wt 129 lb (58.514 kg)  BMI 20.20 kg/m2  Breastfeeding? Yes  General:  alert, cooperative and no distress   Breasts:  inspection negative, no nipple discharge or bleeding, no masses or nodularity palpable  Lungs: clear to auscultation bilaterally  Heart:  regular rate and rhythm  Abdomen: soft, non-tender; bowel sounds normal; no masses,  no organomegaly   Vulva:  normal  Vagina: normal vagina, no discharge, exudate, lesion, or erythema  Cervix:  multiparous appearance  Corpus: normal size, contour, position, consistency, mobility, non-tender  Adnexa:  normal adnexa and no mass, fullness, tenderness  Rectal Exam: Not performed.        Assessment:     Normal postpartum exam. Pap smear not done at today's visit.   Plan:    1. Contraception: OCP (estrogen/progesterone) She has used them in the past without issues 2. Patient is medically cleared to resume all activities of daily living 3. Follow up in: October for annual exam  or as needed.

## 2015-07-07 ENCOUNTER — Encounter: Payer: Self-pay | Admitting: *Deleted

## 2015-08-13 ENCOUNTER — Telehealth: Payer: Self-pay | Admitting: Internal Medicine

## 2015-08-13 NOTE — Telephone Encounter (Signed)
Patient Name: Joanna Morgan DOB: 1985/12/17 Initial Comment Caller states she has pulled a muscle in her neck and shoulder, she's breastfeeding and wants to know what she can take. Nurse Assessment Nurse: Ronnald Ramp, RN, Miranda Date/Time (Eastern Time): 08/13/2015 9:11:33 AM Confirm and document reason for call. If symptomatic, describe symptoms. You must click the next button to save text entered. ---Caller states yesterday she was stretching and had sudden pain in the left side of her neck and shoulder. She is currently breastfeeding. Has the patient traveled out of the country within the last 30 days? ---Not Applicable Does the patient have any new or worsening symptoms? ---Yes Will a triage be completed? ---Yes Related visit to physician within the last 2 weeks? ---No Does the PT have any chronic conditions? (i.e. diabetes, asthma, etc.) ---No Did the patient indicate they were pregnant? ---No Is this a behavioral health or substance abuse call? ---No Guidelines Guideline Title Affirmed Question Affirmed Notes Neck Pain or Stiffness Caused by a twisting, bending, or lifting injury (all triage questions negative) Final Disposition [place], RN, Miranda Comments 6/10 pain scale, Last dose of pain medication was 24 hours ago. Disagree/Comply: Comply

## 2015-08-13 NOTE — Telephone Encounter (Signed)
PLEASE NOTE: All timestamps contained within this report are represented as [place] Standard Time. CONFIDENTIALTY NOTICE: This fax transmission is intended only for the addressee. It contains information that is legally privileged, confidential or otherwise protected from use or disclosure. If you are not the intended recipient, you are strictly prohibited from reviewing, disclosing, copying using or disseminating any of this information or taking any action in reliance on or regarding this information. If you have received this fax in error, please notify us immediately by telephone so that we can arrange for its return to [place]. Phone: [phone], Toll-Free: [phone], Fax: [phone] Page: 1 of 2 Call Id: [id number] [place] Patient Name: Joanna Morgan Gender: Female DOB: 07-06-1986 Age: 30 Y 41 M 7 D Return Phone Number: [phone] (Primary) Address: City/State/Zip: [place] Client [place] [facility] - Client Client Site [place] - Day Physician Baity, [place] Type Call Call Type Triage / Clinical Relationship To Patient Self Appointment Disposition EMR Appointment Not Necessary Info pasted into Epic Yes Return Phone Number [phone] (Primary) Chief Complaint Breastfeeding Question Initial Comment Caller states she has pulled a muscle in her neck and shoulder, she's breastfeeding and wants to know what she can take. PreDisposition Call Doctor Nurse Assessment Nurse: Ronnald Ramp, RN, Miranda Date/Time Eilene Ghazi Time): 08/13/2015 9:11:33 AM Confirm and document reason for call. If symptomatic, describe symptoms. You must click the next button to save text entered. ---Caller states yesterday she was stretching and had sudden pain in the left side of her neck and shoulder. She is currently breastfeeding. Has the patient traveled out of the  country within the last 30 days? ---Not Applicable Does the patient have any new or worsening symptoms? ---Yes Will a triage be completed? ---Yes Related visit to physician within the last 2 weeks? ---No Does the PT have any chronic conditions? (i.e. diabetes, asthma, etc.) ---No Did the patient indicate they were pregnant? ---No Is this a behavioral health or substance abuse call? ---No Guidelines Guideline Title Affirmed Question Affirmed Notes Nurse Date/Time Eilene Ghazi Time) Neck Pain or Stiffness Caused by a twisting, bending, or lifting injury (all triage questions negative) Ronnald Ramp, RN, Miranda 08/13/2015 9:12:34 AM Disp. Time Eilene Ghazi Time) Disposition Final User 08/13/2015 9:19:00 AM Home Care Yes Ronnald Ramp, RN, Miranda PLEASE NOTE: All timestamps contained within this report are represented as [place] Standard Time. CONFIDENTIALTY NOTICE: This fax transmission is intended only for the addressee. It contains information that is legally privileged, confidential or otherwise protected from use or disclosure. If you are not the intended recipient, you are strictly prohibited from reviewing, disclosing, copying using or disseminating any of this information or taking any action in reliance on or regarding this information. If you have received this fax in error, please notify us immediately by telephone so that we can arrange for its return to [place]. Phone: [phone], Toll-Free: [phone], Fax: [phone] Page: 2 of 2 Call Id: [id number] Caller Understands: Yes Disagree/Comply: Comply Care Advice Given Per Guideline PAIN MEDICINES: ACETAMINOPHEN (E.G., TYLENOL): IBUPROFEN (E.G., MOTRIN, ADVIL): NAPROXEN (E.G., ALEVE): HOME CARE: You should be able to treat this at home. REASSURANCE: * It sounds like an over-stretched muscle. We can treat that at home. * For minor injuries, pain should improve over a 2-3 day period and disappear within 7 days. LOCAL COLD: * Apply a cold pack or an ice  bag (wrapped in a moist towel) to the area for  20 minutes. Repeat in 1 hour, then every 4 hours while awake. * Continue this for the first 48 hours after an injury (Reason: to reduce the swelling and pain). LOCAL HEAT: * Beginning 48 hours after an injury, apply a warm washcloth or heating pad for 10 minutes three times a day. * This will help increase circulation and improve healing. SLEEP: * Sleep on the back or side, not the abdomen. * Sleep with a neck collar. Use a foam neck collar (from a pharmacy) OR a small towel wrapped around the neck. (Reason: keep the head from moving too much during sleep.) ACTIVITY: After 48 hours of protecting the neck, begin gentle stretching exercises. STRETCHING EXERCISES: Improve the tone of the neck muscles with 2 or 3 minutes of gentle stretching exercises per day such as touching the chin to each shoulder, touching the ear to each shoulder, and moving the head forward and backward. Don't apply any resistance during these stretching exercises. CALL BACK IF: * Moderate pain (e.g., interferes with normal activities) lasts over 3 days * Pain persists over 2 weeks * Pain begins to shoot into the arms * Numbness or weakness occurs in your arms or legs * You become worse. CARE ADVICE given per Neck Pain (Adult) guideline. * Take 400 mg (two 200 mg pills) by mouth every 6 hours as needed. * Another choice is to take 600 mg (three 200 mg pills) by mouth every 8 hours as needed. * Take 650 mg (two 325 mg pills) by mouth every 4-6 hours as needed. Each Regular Strength Tylenol pill has 325 mg of acetaminophen. The most you should take each day is 3,250 mg (10 Regular Strength pills a day). * Another choice is to take 1,000 mg (two 500 mg pills) every 8 hours as needed. Each Extra Strength Tylenol pill has 500 mg of acetaminophen. The most you should take each day is 3,000 mg (6 Extra Strength pills a day). * Take 220 mg (one 220 mg pill) by mouth every 8 hours as needed.  You may take 440 mg (two 220 mg pills) for your first dose. * The most you should take each day is 660 mg (three 220 mg pills a day), unless your doctor has told you to take more. After Care Instructions Given Call Event Type User Date / Time Description Comments User: Leverne Humbles, RN Date/Time (Eastern Time): 08/13/2015 9:13:31 AM 6/10 pain scale, Last dose of pain medication was 24 hours ago.

## 2015-10-15 ENCOUNTER — Telehealth: Payer: Self-pay | Admitting: *Deleted

## 2015-10-15 DIAGNOSIS — Z3041 Encounter for surveillance of contraceptive pills: Secondary | ICD-10-CM

## 2015-10-15 MED ORDER — NORETHINDRONE 0.35 MG PO TABS
1.0000 | ORAL_TABLET | Freq: Every day | ORAL | Status: DC
Start: 2015-10-15 — End: 2016-09-10

## 2015-10-15 NOTE — Telephone Encounter (Signed)
Sent Micronor to pharmacy per pt req - current OCP affecting Milk supply

## 2015-10-27 ENCOUNTER — Telehealth: Payer: Self-pay | Admitting: *Deleted

## 2015-10-27 NOTE — Telephone Encounter (Signed)
Pt called stating she has started to have abd cramps since beginning Micronor and wanted to know if that was normal. Explained to pt that she may be getting ready to start her cycle but no way to know for sure what the cramps are coming from without being evaluated. Pt states she will take Naproxen and see if cramps subside.

## 2015-11-25 ENCOUNTER — Telehealth: Payer: Self-pay | Admitting: *Deleted

## 2015-11-25 NOTE — Telephone Encounter (Signed)
-----   Message from Francia Greaves sent at 11/25/2015  8:16 AM EDT ----- Regarding: Rx/Breasfeeding Contact: [phone] Called about medicine she is using for a foot, but she is breastfeeding, wants to make sure its safe.  Undecylenic Acid (topical cream)

## 2015-11-25 NOTE — Telephone Encounter (Signed)
Verified medication with Dr Ihor Dow that medication is okay while breast feeding. Pt understands

## 2015-12-23 ENCOUNTER — Telehealth: Payer: Self-pay | Admitting: Internal Medicine

## 2015-12-23 NOTE — Telephone Encounter (Signed)
Patient Name: Joanna Morgan DOB: 05/22/1986 Initial Comment Caller states she has been having migraine for two days now Nurse Assessment Nurse: Vallery Sa, RN, [place] Date/Time (Eastern Time): 12/23/2015 11:49:46 AM Confirm and document reason for call. If symptomatic, describe symptoms. You must click the next button to save text entered. ---Theron Arista states she has had a migraine headache for the past two days (rated as a 7 on the 1 to 10 scale). No fever. No injury in the past 3 days. Has the patient traveled out of the country within the last 30 days? ---No Does the patient have any new or worsening symptoms? ---Yes Will a triage be completed? ---Yes Related visit to physician within the last 2 weeks? ---No Does the PT have any chronic conditions? (i.e. diabetes, asthma, etc.) ---Yes List chronic conditions. ---Migraines Is the patient pregnant or possibly pregnant? (Ask all females between the ages of 2-55) ---No Is this a behavioral health or substance abuse call? ---No Guidelines Guideline Title Affirmed Question Affirmed Notes Headache [1] MODERATE headache (e.g., interferes with normal activities) AND [2] present > 24 hours AND [3] unexplained (Exceptions: analgesics not tried, typical migraine, or headache part of viral illness) Final Disposition User See Physician within [place], RN, [employer] Referrals REFERRED TO PCP OFFICE Disagree/Comply: Comply Scheduled appointment 12/24/15 at 10:30am with Garnette Gunner, NP.

## 2015-12-23 NOTE — Telephone Encounter (Signed)
TH scheduled appt on 12/24/15 at 10:30 with Avie Echevaria NP.

## 2015-12-24 ENCOUNTER — Ambulatory Visit (INDEPENDENT_AMBULATORY_CARE_PROVIDER_SITE_OTHER): Payer: 59 | Admitting: Internal Medicine

## 2015-12-24 ENCOUNTER — Encounter: Payer: Self-pay | Admitting: Internal Medicine

## 2015-12-24 VITALS — BP 106/60 | HR 98 | Temp 98.6°F | Wt 120.0 lb

## 2015-12-24 DIAGNOSIS — G43109 Migraine with aura, not intractable, without status migrainosus: Secondary | ICD-10-CM | POA: Diagnosis not present

## 2015-12-24 MED ORDER — KETOROLAC TROMETHAMINE 30 MG/ML IJ SOLN
30.0000 mg | Freq: Once | INTRAMUSCULAR | Status: AC
  Administered 2015-12-24: 30 mg via INTRAMUSCULAR

## 2015-12-24 NOTE — Patient Instructions (Signed)

## 2015-12-24 NOTE — Progress Notes (Addendum)
Subjective:    Patient ID: Joanna Morgan, female    DOB: [birth year], 30 y.o.   MRN: [id number]  HPI  Pt presents to the clinic today with c/o a headache. This started 2-3 days ago. The pain is located in the left side of her head. She describes the pain as sore and achy. It radiates into her neck. She does have some sharp stabbing pains, and pressure behind her eyes which has gotten better today. She does have sensitivity to light but not sound. She has some nausea which has improved but no vomiting. She has taken Tylenol and Aleve without any relief. She denies any injury to her neck. She has never had an eye exam. She has had migraines in the past and reports this feels the same.  Review of Systems      Past Medical History  Diagnosis Date  . Anxiety   . ADHD (attention deficit hyperactivity disorder)   . Depression   . Abnormal Pap smear 04/26/11    LSIL HPV/MILD DYSPLASIA CIN1  . Stomach ulcer     Current Outpatient Prescriptions  Medication Sig Dispense Refill  . ibuprofen (ADVIL,MOTRIN) 600 MG tablet Take 1 tablet (600 mg total) by mouth every 6 (six) hours. 30 tablet 0  . norethindrone (MICRONOR,CAMILA,ERRIN) 0.35 MG tablet Take 1 tablet (0.35 mg total) by mouth daily. 1 Package 11   No current facility-administered medications for this visit.    Allergies  Allergen Reactions  . Sulfa Antibiotics Shortness Of Breath  . Codeine Phosphate Rash    Family History  Problem Relation Age of Onset  . Cancer Mother     MELANOMA IN EYE  . Depression Mother   . Schizophrenia Father   . Asthma Sister   . Cancer Maternal Uncle     liver  . Cancer Maternal Grandmother     brain  . Cancer Maternal Grandfather     lung  . Diabetes Paternal Grandmother     Social History   Social History  . Marital Status: Married    Spouse Name: N/A  . Number of Children: N/A  . Years of Education: N/A   Occupational History  . Not on file.   Social History Main Topics  .  Smoking status: Former Smoker    Quit date: 09/21/2010  . Smokeless tobacco: Never Used  . Alcohol Use: No  . Drug Use: No  . Sexual Activity: Yes    Birth Control/ Protection: None   Other Topics Concern  . Not on file   Social History Narrative     Constitutional: Pt reports headache. Denies fever, malaise, fatigue, or abrupt weight changes.  HEENT: Denies eye pain, eye redness, ear pain, ringing in the ears, wax buildup, runny nose, nasal congestion, bloody nose, or sore throat. Respiratory: Denies difficulty breathing, shortness of breath, cough or sputum production.   Cardiovascular: Denies chest pain, chest tightness, palpitations or swelling in the hands or feet.  Neurological: Denies dizziness, difficulty with memory, difficulty with speech or problems with balance and coordination.    No other specific complaints in a complete review of systems (except as listed in HPI above).  Objective:   Physical Exam   BP 106/60 mmHg  Pulse 98  Temp(Src) 98.6 F (37 C) (Oral)  Wt 120 lb (54.432 kg)  SpO2 98% Wt Readings from Last 3 Encounters:  12/24/15 120 lb (54.432 kg)  06/25/15 129 lb (58.514 kg)  05/12/15 150 lb (68.04 kg)  General: Appears her stated age,  in NAD.  HEENT: Head: normal shape and size; Eyes: sclera white, no icterus, conjunctiva pink, PERRLA and EOMs intact;  Cardiovascular: Normal rate and rhythm. S1,S2 noted.  No murmur, rubs or gallops noted.  Pulmonary/Chest: Normal effort and positive vesicular breath sounds. No respiratory distress. No wheezes, rales or ronchi noted.  Musculoskeletal: Normal flexion, extension and rotation of the cervical spine. No bony tenderness noted. Tension noted of the left paracervical muscles. Neurological: Alert and oriented.  Coordination normal.   BMET    Component Value Date/Time   NA 143 04/18/2007 1137   K 5.0 04/18/2007 1137   CL 107 04/18/2007 1137   CO2 31 04/18/2007 1137   GLUCOSE 74 04/18/2007 1137    BUN 2* 04/18/2007 1137   CREATININE 0.7 04/18/2007 1137   CALCIUM 9.5 04/18/2007 1137   GFRNONAA 115 04/18/2007 1137   GFRAA 139 04/18/2007 1137    Lipid Panel  No results found for: CHOL, TRIG, HDL, CHOLHDL, VLDL, LDLCALC  CBC    Component Value Date/Time   WBC 8.4 05/13/2015 0550   RBC 3.69* 05/13/2015 0550   HGB 11.3* 05/13/2015 0550   HGB 12.4 09/22/2014   HCT 33.5* 05/13/2015 0550   HCT 38 09/22/2014   PLT 146* 05/13/2015 0550   PLT 256 09/22/2014   MCV 90.8 05/13/2015 0550   MCH 30.6 05/13/2015 0550   MCHC 33.7 05/13/2015 0550   RDW 12.7 05/13/2015 0550   LYMPHSABS 1.4 02/25/2015 0921   MONOABS 0.6 02/25/2015 0921   EOSABS 0.2 02/25/2015 0921   BASOSABS 0.0 02/25/2015 0921    Hgb A1C No results found for: HGBA1C      Assessment & Plan:   Migraine, not intractable, with aura:  30 mg Toradol IM today- advised her to pump and dump for the next 12 hours Go home and get some rest Drink plenty of water Ok to alternate Tylenol and Ibuprofen as needed  RTC as needed or if symptoms persist or worsen  Nthony Lefferts, NP

## 2015-12-24 NOTE — Addendum Note (Signed)
Addended by: Lurlean Nanny on: 12/24/2015 12:01 PM   Modules accepted: Orders

## 2015-12-24 NOTE — Progress Notes (Signed)
Pre visit review using our clinic review tool, if applicable. No additional management support is needed unless otherwise documented below in the visit note. 

## 2015-12-28 ENCOUNTER — Telehealth: Payer: Self-pay | Admitting: *Deleted

## 2015-12-28 NOTE — Telephone Encounter (Signed)
Would she be willing to try something like Imitrex, before we refer to neurology?

## 2015-12-28 NOTE — Telephone Encounter (Signed)
Pt wants to know if she can take with breastfeeding---if so she is willing to try before neuro

## 2015-12-28 NOTE — Telephone Encounter (Signed)
Patient left a voicemail stating that she was seen last week for migraines. Patient stated that they are not going away and wants a referral to a neurologist.

## 2015-12-29 ENCOUNTER — Other Ambulatory Visit: Payer: Self-pay | Admitting: Internal Medicine

## 2015-12-29 DIAGNOSIS — R519 Headache, unspecified: Secondary | ICD-10-CM

## 2015-12-29 DIAGNOSIS — R51 Headache: Principal | ICD-10-CM

## 2015-12-29 NOTE — Telephone Encounter (Signed)
Pt states she would like to go ahead with the referral and not start medication at this time

## 2015-12-29 NOTE — Telephone Encounter (Signed)
Referral placed. She can take Tylenol for now

## 2015-12-29 NOTE — Telephone Encounter (Signed)
She would have to pump and dump for 12 hours after taking the medication. Let me know if she is ok with that or wants to proceed with neurology referral.

## 2015-12-30 ENCOUNTER — Other Ambulatory Visit: Payer: Self-pay | Admitting: Internal Medicine

## 2015-12-30 DIAGNOSIS — R51 Headache: Principal | ICD-10-CM

## 2015-12-30 DIAGNOSIS — R519 Headache, unspecified: Secondary | ICD-10-CM

## 2016-02-02 ENCOUNTER — Encounter: Payer: Self-pay | Admitting: Internal Medicine

## 2016-03-30 ENCOUNTER — Encounter: Payer: Self-pay | Admitting: Family Medicine

## 2016-03-30 ENCOUNTER — Ambulatory Visit (INDEPENDENT_AMBULATORY_CARE_PROVIDER_SITE_OTHER): Payer: 59 | Admitting: Family Medicine

## 2016-03-30 VITALS — BP 105/70 | HR 74 | Resp 18 | Ht 67.0 in | Wt 118.0 lb

## 2016-03-30 DIAGNOSIS — Z124 Encounter for screening for malignant neoplasm of cervix: Secondary | ICD-10-CM | POA: Diagnosis not present

## 2016-03-30 DIAGNOSIS — Z01419 Encounter for gynecological examination (general) (routine) without abnormal findings: Secondary | ICD-10-CM | POA: Diagnosis not present

## 2016-03-30 NOTE — Patient Instructions (Signed)
Prenatal Vitamin and Mineral Combinations (oral solid dosage forms) What is this medicine? PRENATAL VITAMIN AND MINERAL combinations are used before, during, and after pregnancy to help provide provide good nutrition. This medicine may be used for other purposes; ask your health care provider or pharmacist if you have questions. What should I tell my health care provider before I take this medicine? They need to know if you have any of these conditions: -bleeding or clotting disorder -history of anemia of any type -other chronic health condition -an unusual or allergic reaction to vitamins, minerals, other medicines, foods, dyes, or preservatives How should I use this medicine? Take this medicine by mouth with a glass of water. You can take it with or without food. If it upsets your stomach, take it with food. Chewable prenatal vitamin tablets may be chewed completely before swallowing. Follow the directions on the prescription label. The usual dose is taken once a day. Do not take your medicine more often than directed. Contact your pediatrician regarding the use of this medicine in children. Special care may be needed. This medicine is intended for females who are pregnant, breast-feeding, or may become pregnant. Overdosage: If you think you have taken too much of this medicine contact a poison control center or emergency room at once. NOTE: This medicine is only for you. Do not share this medicine with others. What if I miss a dose? If you miss a dose, take it as soon as you can. If it is almost time for your next dose, take only that dose. Do not take double or extra doses. What may interact with this medicine? -alendronate -antacids -cefdinir -cefditoren -etidronate -fluoroquinolone antibiotics (examples: ciprofloxacin, gatifloxacin, levofloxacin) -ibandronate -levodopa -risedronate -tetracycline antibiotics (examples: doxycycline, minocycline, tetracycline) -thyroid  hormones -warfarin This list may not describe all possible interactions. Give your health care provider a list of all the medicines, herbs, non-prescription drugs, or dietary supplements you use. Also tell them if you smoke, drink alcohol, or use illegal drugs. Some items may interact with your medicine. What should I watch for while using this medicine? See your health care professional for regular checks on your progress. Remember that vitamin and mineral supplements do not replace the need for good nutrition from a balanced diet. Stools commonly change color when vitamins and minerals are taken. Notify your health care professional if this change is alarming or accompanied by other symptoms, like abdominal pain. What side effects may I notice from receiving this medicine? Side effects that you should report to your doctor or health care professional as soon as possible: -allergic reaction such as skin rash or difficulty breathing -vomiting Side effects that usually do not require medical attention (report to your doctor or health care professional if they continue or are bothersome): -nausea -stomach upset This list may not describe all possible side effects. Call your doctor for medical advice about side effects. You may report side effects to FDA at 1-800-FDA-1088. Where should I keep my medicine? Keep out of the reach of children. Most vitamins and minerals should be stored at controlled room temperature. Check your specific product directions. Protect from heat and moisture. Throw away any unused medicine after the expiration date. NOTE: This sheet is a summary. It may not cover all possible information. If you have questions about this medicine, talk to your doctor, pharmacist, or health care provider.    2016, Elsevier/Gold Standard. (2015-01-22 09:02:38)

## 2016-03-30 NOTE — Progress Notes (Signed)
   CLINIC ENCOUNTER NOTE  History:  30 y.o. [id number] here today for well woman exam. She denies any abnormal vaginal discharge, bleeding, pelvic pain or other concerns.  She does report pain with sex, has had since she started having sex. Using lubricant, tried positional changes with partner. Describes the pain as a deep aching/cramping. She is currently breastfeeding her 41monthold son reese. She reports she is not taking a PNV or daily vitamin.   History of abnormal pap-- 2013 and 2014 pap were both NIL  Past Medical History:  Diagnosis Date  . Abnormal Pap smear 04/26/11   LSIL HPV/MILD DYSPLASIA CIN1  . ADHD (attention deficit hyperactivity disorder)   . Anxiety   . Depression   . Stomach ulcer     Past Surgical History:  Procedure Laterality Date  . NO PAST SURGERIES    . WISDOM TOOTH EXTRACTION      The following portions of the patient's history were reviewed and updated as appropriate: allergies, current medications, past family history, past medical history, past social history, past surgical history and problem list.   Health Maintenance:  Normal pap and negative HRHPV on 10/7 2014  Review of Systems:  Pertinent items noted in HPI and remainder of comprehensive ROS otherwise negative.  Objective:  Physical Exam BP 105/70 (BP Location: Left Arm, Patient Position: Sitting, Cuff Size: Normal)   Pulse 74   Resp 18   Ht _0  (1.702 m)   Wt 118 lb (53.5 kg)   Breastfeeding? Yes   BMI 18.48 kg/m  CONSTITUTIONAL: Well-developed, well-nourished female in no acute distress. Thin but well nourished. Interactive and makes eye contact HENT:  Normocephalic, atraumatic. External right and left ear normal. Oropharynx is clear and moist EYES: Conjunctivae and EOM are normal. Pupils are equal, round, and reactive to light. No scleral icterus.  NECK: Normal range of motion, supple, no masses SKIN: Skin is warm and dry. No rash noted. Not diaphoretic. No erythema. No  pallor. NSpray Alert and oriented to person, place, and time. Normal reflexes, muscle tone coordination. No cranial nerve deficit noted. PSYCHIATRIC: Normal mood and affect. Normal behavior. Normal judgment and thought content. CARDIOVASCULAR: Normal heart rate noted RESPIRATORY: Effort and breath sounds normal, no problems with respiration noted ABDOMEN: Soft, no distention noted.   PELVIC: Normal appearing external genitalia; normal appearing vaginal mucosa and cervix.  No abnormal discharge noted.  Normal uterine size, no other palpable masses, no uterine or adnexal tenderness. MUSCULOSKELETAL: Normal range of motion. No edema noted.  Labs and Imaging No results found.  Assessment & Plan:  1. Well woman exam with routine gynecological exam -recommended MVI or at least calcium supplement given breastfeeding. Reviewed dietarty changes to support lactation - Reviewed contraception method, patient is happy with micronor and has a component of lactational amenorrhea.  - Cytology - PAP today with reflex. Patient has return to normal screening interval given last two paps were NIL - Reviewed trial of ibuprofen for 4 days (q6 hrs) to determine if this helps reduce pelvic pain with sex.  - Patient is low risk for STI and does not require STI testing today - Patient low risk for breast and ovarian cancer and screened negative for BRCA testing  Routine preventative health maintenance measures emphasized. Please refer to After Visit Summary for other counseling recommendations.   Return in about 1 year (around 03/30/2017) for Routine well adult care.

## 2016-03-31 ENCOUNTER — Encounter: Payer: Self-pay | Admitting: *Deleted

## 2016-03-31 LAB — CYTOLOGY - PAP

## 2016-04-04 ENCOUNTER — Telehealth: Payer: Self-pay | Admitting: *Deleted

## 2016-04-04 NOTE — Telephone Encounter (Signed)
-----   Message from Caren Macadam, MD sent at 04/01/2016  7:43 PM EDT ----- Please call--pap was negative. Next will be due in 2020. Thanks, Dr. Ernestina Patches

## 2016-04-04 NOTE — Telephone Encounter (Signed)
Called pt, no answer, left voicemail that labs were normal.

## 2016-06-30 ENCOUNTER — Ambulatory Visit: Payer: 59 | Admitting: Internal Medicine

## 2016-09-10 ENCOUNTER — Other Ambulatory Visit: Payer: Self-pay | Admitting: Obstetrics & Gynecology

## 2016-09-10 DIAGNOSIS — Z3041 Encounter for surveillance of contraceptive pills: Secondary | ICD-10-CM

## 2016-09-15 NOTE — Telephone Encounter (Signed)
Refill for birth control sent to pharmacy per Dr Hulan Fray order.

## 2017-02-21 ENCOUNTER — Ambulatory Visit: Payer: 59 | Admitting: Family Medicine

## 2017-02-21 ENCOUNTER — Telehealth: Payer: Self-pay | Admitting: Internal Medicine

## 2017-02-21 NOTE — Telephone Encounter (Signed)
Patient Name: Joanna Morgan  DOB: 1985/11/08    Initial Comment Caller fell on wrist needs to know what to do, hurts a lot.    Nurse Assessment  Nurse: Thad Ranger RN, Denise Date/Time (Eastern Time): 02/21/2017 4:53:29 PM  Confirm and document reason for call. If symptomatic, describe symptoms. ---Caller fell on right wrist within the last 30 mins, and she needs to know what to do. She tried to use her wrist to break her fall. The wrist is swollen, does not appear to be dislocated, and pain is 5/10.  Does the patient have any new or worsening symptoms? ---Yes  Will a triage be completed? ---Yes  Related visit to physician within the last 2 weeks? ---No  Does the PT have any chronic conditions? (i.e. diabetes, asthma, etc.) ---No  Is the patient pregnant or possibly pregnant? (Ask all females between the ages of 18-55) ---No  Is this a behavioral health or substance abuse call? ---No     Guidelines    Guideline Title Affirmed Question Affirmed Notes  Hand and Wrist Injury Minor injury or bruising from direct blow    Final Disposition User   [place], RN, Langley Gauss    Disagree/Comply: Comply

## 2017-02-21 NOTE — Telephone Encounter (Signed)
Do you want pt to schedule appt to have xray?

## 2017-02-21 NOTE — Telephone Encounter (Signed)
PLEASE NOTE: All timestamps contained within this report are represented as [place] Standard Time. CONFIDENTIALTY NOTICE: This fax transmission is intended only for the addressee. It contains information that is legally privileged, confidential or otherwise protected from use or disclosure. If you are not the intended recipient, you are strictly prohibited from reviewing, disclosing, copying using or disseminating any of this information or taking any action in reliance on or regarding this information. If you have received this fax in error, please notify us immediately by telephone so that we can arrange for its return to [place]. Phone: [phone], Toll-Free: [phone], Fax: [phone] Page: 1 of 2 Call Id: [id number] [place] Patient Name: Joanna Morgan Gender: Female DOB: 1986-05-17 Age: 31 Y 2 M 19 D Return Phone Number: [phone] (Primary) City/State/Zip: [place] [address] Client [facility] - Client Client Site [facility] Day Physician Webb Silversmith - NP Who Is Calling Patient / Member / Family / Caregiver Call Type Triage / Clinical Relationship To Patient Self Return Phone Number [phone] (Primary) Chief Complaint Hand or Wrist Injury Reason for Call Symptomatic / Request for [place] fell on wrist needs to know what to do, hurts a lot. Appointment Disposition EMR Appointment Not Necessary Info pasted into Epic Yes Nurse Assessment Nurse: Thad Ranger, RN, Langley Gauss Date/Time (Eastern Time): 02/21/2017 4:53:29 PM Confirm and document reason for call. If symptomatic, describe symptoms. ---Caller fell on right wrist within the last 30 mins, and she needs to know what to do. She tried to use her wrist to break her fall. The wrist is swollen, does not appear to be dislocated, and pain is 5/10. Does the  PT have any chronic conditions? (i.e. diabetes, asthma, etc.) ---No Is the patient pregnant or possibly pregnant? (Ask all females between the ages of 74-55) ---No Guidelines Guideline Title Affirmed Question Hand and Wrist Injury Minor injury or bruising from direct blow Disp. Time Eilene Ghazi Time) Disposition Final User 02/21/2017 5:01:46 PM [place], RN, [facility] Advice Given Per Guideline HOME CARE: You should be able to treat this at home. REASSURANCE AND EDUCATION: It sounds like a bruised muscle or bone. We can treat that at home. LOCAL COLD: * For bruises or swelling, apply a cold pack or an ice bag (wrapped in a moist towel) to the area for 20 minutes. * Repeat in 1 hour, then as needed for the first 48 hours after the injury. (Reason: to reduce the bruising, swelling, and pain.) LOCAL HEAT: * Beginning 48 hours after an injury, apply a warm washcloth or heating pad for 10 minutes three times a day. * This will help increase circulation and improve healing. PAIN MEDICINES: IBUPROFEN (E.G., MOTRIN, ADVIL): * Take 400 mg (two 200 mg pills) by mouth every 6 hours as needed. * Another choice is to take 600 mg (three 200 mg pills) by mouth every 8 hours as needed. CAUTION - NSAIDS (E.G., IBUPROFEN, NAPROXEN): * You may take this medicine with or without food. Taking it with food or milk may lessen the chance the drug will upset your stomach. EXPECTED COURSE: Pain and swelling usually peak on day 2 or 3. Swelling is usually gone by 7 days. Pain may take 2 weeks to completely resolve. CALL BACK IF: * Severe pain persists over 2 hours after pain medicine and ice * Swelling or bruise becomes over 2 inches (  5 cm). * Pain not improved after 3 days (R/O: mild fracture) * Pain or swelling lasts over 2 weeks * You become worse. CARE ADVICE given per Hand and Wrist Injury (Adult) guideline. PLEASE NOTE: All timestamps contained within this report are represented as [place] Standard  Time. CONFIDENTIALTY NOTICE: This fax transmission is intended only for the addressee. It contains information that is legally privileged, confidential or otherwise protected from use or disclosure. If you are not the intended recipient, you are strictly prohibited from reviewing, disclosing, copying using or disseminating any of this information or taking any action in reliance on or regarding this information. If you have received this fax in error, please notify us immediately by telephone so that we can arrange for its return to [place]. Phone: [phone], Toll-Free: [phone], Fax: [phone] Page: 2 of 2 Call Id: [id number]

## 2017-02-22 NOTE — Telephone Encounter (Signed)
Yes, she needs an appt to be seen. I can see her at 1:45 today.

## 2017-02-22 NOTE — Telephone Encounter (Signed)
Unable to reach pt by phone; spoke with pts husband per DPR and call pt back in couple of minutes; pt is at home so could be here by 1:45. Pt said the swelling has gone down and can move rt wrist in slow motion with slight tightness; pain is much better today and pt has not had to take ibuprofen today. Pt will cb if needed. FYI to Avie Echevaria NP.

## 2017-08-07 ENCOUNTER — Other Ambulatory Visit: Payer: Self-pay

## 2017-08-07 DIAGNOSIS — Z3041 Encounter for surveillance of contraceptive pills: Secondary | ICD-10-CM

## 2017-08-07 MED ORDER — NORETHINDRONE 0.35 MG PO TABS: 1.0000 | ORAL_TABLET | Freq: Every day | ORAL | 3 refills | Status: DC

## 2017-08-07 NOTE — Telephone Encounter (Signed)
Refill on birth control pills sent to the pharmacy.

## 2017-10-19 ENCOUNTER — Ambulatory Visit: Payer: 59 | Admitting: Obstetrics & Gynecology

## 2017-11-02 ENCOUNTER — Ambulatory Visit (INDEPENDENT_AMBULATORY_CARE_PROVIDER_SITE_OTHER): Payer: BLUE CROSS/BLUE SHIELD | Admitting: Obstetrics & Gynecology

## 2017-11-02 ENCOUNTER — Encounter: Payer: Self-pay | Admitting: Obstetrics & Gynecology

## 2017-11-02 VITALS — BP 109/72 | HR 72

## 2017-11-02 DIAGNOSIS — N6320 Unspecified lump in the left breast, unspecified quadrant: Secondary | ICD-10-CM | POA: Diagnosis not present

## 2017-11-02 DIAGNOSIS — Z01419 Encounter for gynecological examination (general) (routine) without abnormal findings: Secondary | ICD-10-CM

## 2017-11-02 NOTE — Progress Notes (Signed)
Subjective:    Joanna Morgan is a 32 y.o. married P2 (35 and 40 yo kids) female who presents for an annual exam. She found a breast lump about 2 weeks ago. No FH of breast, no nipple discharge.  The patient is sexually active. GYN screening history: last pap: was normal. The patient wears seatbelts: yes. The patient participates in regular exercise: no. Has the patient ever been transfused or tattooed?: no. The patient reports that there is not domestic violence in her life.   Menstrual History: OB History    Gravida  2   Para  2   Term  2   Preterm  0   AB  0   Living  2     SAB  0   TAB  0   Ectopic  0   Multiple  0   Live Births  2           Menarche age: 65 Patient's last menstrual period was 10/02/2017.    The following portions of the patient's history were reviewed and updated as appropriate: allergies, current medications, past family history, past medical history, past social history, past surgical history and problem list.  Review of Systems Pertinent items are noted in HPI.   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    Objective:    BP 109/72   Pulse 72   LMP 10/02/2017   General Appearance:    Alert, cooperative, no distress, appears stated age  Head:    Normocephalic, without obvious abnormality, atraumatic  Eyes:    PERRL, conjunctiva/corneas clear, EOM's intact, fundi    benign, both eyes  Ears:    Normal TM's and external ear canals, both ears  Nose:   Nares normal, septum midline, mucosa normal, no drainage    or sinus tenderness  Throat:   Lips, mucosa, and tongue normal; teeth and gums normal  Neck:   Supple, symmetrical, trachea midline, no adenopathy;    thyroid:  no enlargement/tenderness/nodules; no carotid   bruit or JVD  Back:     Symmetric, no curvature, ROM normal, no CVA tenderness  Lungs:     Clear to  auscultation bilaterally, respirations unlabored  Chest Wall:    No tenderness or deformity   Heart:    Regular rate and rhythm, S1 and S2 normal, no murmur, rub   or gallop  Breast Exam:    No tenderness, masses, or nipple abnormality in right breast. Left breast has a 1 cm firm, mobile mass at the 11 o'clock position.  Abdomen:     Soft, non-tender, bowel sounds active all four quadrants,    no masses, no organomegaly  Genitalia:    Normal female without lesion, discharge or tenderness, normal size and shape, anteverted, mobile, non-tender, normal adnexal exam      Extremities:   Extremities normal, atraumatic, no cyanosis or edema  Pulses:   2+ and symmetric all extremities  Skin:   Skin color, texture, turgor normal, no rashes or lesions  Lymph nodes:   Cervical, supraclavicular, and axillary nodes normal  Neurologic:   CNII-XII intact, normal strength, sensation and reflexes    throughout  .    Assessment:    Healthy female exam.   Breast lump (left)   Plan:     Thin prep Pap smear.  next year Breast u/s and diagnostic mammo

## 2017-11-02 NOTE — Progress Notes (Signed)
Last pap 2017

## 2017-11-08 ENCOUNTER — Ambulatory Visit
Admission: RE | Admit: 2017-11-08 | Discharge: 2017-11-08 | Disposition: A | Discharge status: Home / Self Care | Payer: BLUE CROSS/BLUE SHIELD | Source: Ambulatory Visit | Attending: Obstetrics & Gynecology | Admitting: Obstetrics & Gynecology

## 2017-11-08 DIAGNOSIS — N6320 Unspecified lump in the left breast, unspecified quadrant: Secondary | ICD-10-CM

## 2017-11-16 ENCOUNTER — Other Ambulatory Visit: Payer: Self-pay | Admitting: Obstetrics & Gynecology

## 2017-11-27 ENCOUNTER — Ambulatory Visit: Payer: BLUE CROSS/BLUE SHIELD | Admitting: Primary Care

## 2017-11-27 ENCOUNTER — Encounter: Payer: Self-pay | Admitting: Primary Care

## 2017-11-27 VITALS — BP 104/64 | HR 78 | Temp 98.0°F | Ht 67.0 in | Wt 126.5 lb

## 2017-11-27 DIAGNOSIS — L237 Allergic contact dermatitis due to plants, except food: Secondary | ICD-10-CM

## 2017-11-27 MED ORDER — METHYLPREDNISOLONE ACETATE 80 MG/ML IJ SUSP
80.0000 mg | Freq: Once | INTRAMUSCULAR | Status: AC
  Administered 2017-11-27: 80 mg via INTRAMUSCULAR

## 2017-11-27 MED ORDER — TRIAMCINOLONE ACETONIDE 0.1 % EX CREA
1.0000 | TOPICAL_CREAM | Freq: Two times a day (BID) | CUTANEOUS | 0 refills | Status: DC
Start: 2017-11-27 — End: 2017-12-14

## 2017-11-27 MED ORDER — PREDNISONE 20 MG PO TABS: ORAL_TABLET | ORAL | 0 refills | Status: DC

## 2017-11-27 NOTE — Patient Instructions (Addendum)
Start prednisone tablets tomorrow. Take 2 tablets for 3 days, then 1 tablet for 2 days.  You may apply the triamcinolone cream twice daily for one week as needed.  Please call me if your rash returns or never goes away, this may happen given the short course of prednisone.  It was a pleasure meeting you!   Poison Ivy Dermatitis Poison ivy dermatitis is redness and soreness (inflammation) of the skin. It is caused by a chemical that is found on the leaves of the poison ivy plant. You may also have itching, a rash, and blisters. Symptoms often clear up in 1-2 weeks. You may get this condition by touching a poison ivy plant. You can also get it by touching something that has the chemical on it. This may include animals or objects that have come in contact with the plant. Follow these instructions at home: General instructions  Take or apply over-the-counter and prescription medicines only as told by your doctor.  If you touch poison ivy, wash your skin with soap and cold water right away.  Use hydrocortisone creams or calamine lotion as needed to help with itching.  Take oatmeal baths as needed. Use colloidal oatmeal. You can get this at a pharmacy or grocery store. Follow the instructions on the package.  Do not scratch or rub your skin.  While you have the rash, wash your clothes right after you wear them. Prevention  Know what poison ivy looks like so you can avoid it. This plant has three leaves with flowering branches on a single stem. The leaves are glossy. They have uneven edges that come to a point at the front.  If you have touched poison ivy, wash with soap and water right away. Be sure to wash under your fingernails.  When hiking or camping, wear long pants, a long-sleeved shirt, tall socks, and hiking boots. You can also use a lotion on your skin that helps to prevent contact with the chemical on the plant.  If you think that your clothes or outdoor gear came in contact  with poison ivy, rinse them off with a garden hose before you bring them inside your house. Contact a doctor if:  You have open sores in the rash area.  You have more redness, swelling, or pain in the affected area.  You have redness that spreads beyond the rash area.  You have fluid, blood, or pus coming from the affected area.  You have a fever.  You have a rash over a large area of your body.  You have a rash on your eyes, mouth, or genitals.  Your rash does not get better after a few days. Get help right away if:  Your face swells or your eyes swell shut.  You have trouble breathing.  You have trouble swallowing. This information is not intended to replace advice given to you by your health care provider. Make sure you discuss any questions you have with your health care provider. Document Released: 07/30/2010 Document Revised: 12/03/2015 Document Reviewed: 12/03/2014 Elsevier Interactive Patient Education  [employer].

## 2017-11-27 NOTE — Addendum Note (Signed)
Addended by: Jacqualin Combes on: 11/27/2017 12:07 PM   Modules accepted: Orders

## 2017-11-27 NOTE — Progress Notes (Signed)
Subjective:    Patient ID: Mardella Layman, female    DOB: [birth year], 32 y.o.   MRN: [id number]  HPI  Ms. Milkovich is a 32 year old female who presents today with a chief complaint of rash.  Her rash is located to the posterior trunk, left buttocks, left lower and upper extremity for which she noticed it one week ago. Her husband was pulling weeds in the yard at that time and experienced a more mild case of the rash. She washed his clothing and the bed sheets.   She's tried calamine lotion, OTC poison ivy wash, and benadryl without improvement. She denies wheezing, scratchy throat, shortness of breath.   Review of Systems  Constitutional: Negative for fever.  Respiratory: Negative for shortness of breath and wheezing.   Skin: Positive for rash.       Past Medical History:  Diagnosis Date  . Abnormal Pap smear 04/26/11   LSIL HPV/MILD DYSPLASIA CIN1  . ADHD (attention deficit hyperactivity disorder)   . Anxiety   . Depression   . Stomach ulcer      Social History   Socioeconomic History  . Marital status: Married    Spouse name: Not on file  . Number of children: Not on file  . Years of education: Not on file  . Highest education level: Not on file  Occupational History  . Not on file  Social Needs  . Financial resource strain: Not on file  . Food insecurity:    Worry: Not on file    Inability: Not on file  . Transportation needs:    Medical: Not on file    Non-medical: Not on file  Tobacco Use  . Smoking status: Former Smoker    Last attempt to quit: 09/21/2010    Years since quitting: 7.1  . Smokeless tobacco: Never Used  Substance and Sexual Activity  . Alcohol use: Yes    Alcohol/week: 0.6 oz    Types: 1 Glasses of wine per week  . Drug use: No  . Sexual activity: Yes    Birth control/protection: Pill  Lifestyle  . Physical activity:    Days per week: Not on file    Minutes per session: Not on file  . Stress: Not on file  Relationships  . Social  connections:    Talks on phone: Not on file    Gets together: Not on file    Attends religious service: Not on file    Active member of club or organization: Not on file    Attends meetings of clubs or organizations: Not on file    Relationship status: Not on file  . Intimate partner violence:    Fear of current or ex partner: Not on file    Emotionally abused: Not on file    Physically abused: Not on file    Forced sexual activity: Not on file  Other Topics Concern  . Not on file  Social History Narrative  . Not on file    Past Surgical History:  Procedure Laterality Date  . NO PAST SURGERIES    . WISDOM TOOTH EXTRACTION      Family History  Problem Relation Age of Onset  . Cancer Mother        MELANOMA IN EYE  . Depression Mother   . Schizophrenia Father   . Asthma Sister   . Cancer Maternal Grandmother        brain  . Cancer Maternal Grandfather  lung  . Diabetes Paternal Grandmother   . Cancer Maternal Uncle        liver    Allergies  Allergen Reactions  . Sulfa Antibiotics Shortness Of Breath  . Codeine Phosphate Rash    Current Outpatient Medications on File Prior to Visit  Medication Sig Dispense Refill  . norethindrone (CAMILA) 0.35 MG tablet Take 1 tablet (0.35 mg total) by mouth daily. 3 Package 3   No current facility-administered medications on file prior to visit.     BP 104/64   Pulse 78   Temp 98 F (36.7 C) (Oral)   Ht 5\' 7"  (1.702 m)   Wt 126 lb 8 oz (57.4 kg)   LMP 11/05/2017   SpO2 98%   BMI 19.81 kg/m    Objective:   Physical Exam  Constitutional: She appears well-nourished.  Cardiovascular: Normal rate.  Pulmonary/Chest: Effort normal and breath sounds normal. She has no wheezes.  Skin:     Widespread rash to posterior trunk, left buttocks, left lower extremity, left upper extremity. Raised, red. No vesicals.           Assessment & Plan:  Poison Ivy Dermatitis:  Widespread as noted. She prefers to avoid  long durations of prednisone as it causes her to feel bad. Discussed that shorter courses of prednisone may not be enough, the rash may return.  Will provide IM Depo Medrol today. Start lower dose and shorter duration of prednisone tomorrow. Rx for Triamcinolone cream provided to use PRN. Return precautions provided.  Pleas Koch, NP

## 2017-12-14 ENCOUNTER — Other Ambulatory Visit: Payer: Self-pay | Admitting: Internal Medicine

## 2017-12-14 DIAGNOSIS — L237 Allergic contact dermatitis due to plants, except food: Secondary | ICD-10-CM

## 2017-12-14 MED ORDER — TRIAMCINOLONE ACETONIDE 0.1 % EX CREA: 1.0000 "application " | TOPICAL_CREAM | Freq: Two times a day (BID) | CUTANEOUS | 0 refills | Status: DC

## 2017-12-14 NOTE — Telephone Encounter (Signed)
Refill sent to pharmacy.   

## 2017-12-14 NOTE — Telephone Encounter (Signed)
Copied from [place] [phone]. Topic: Quick Communication - See Telephone Encounter >> Dec 14, 2017  8:53 AM Conception Chancy, NT wrote: patient is calling to leave Allie Bossier a message. She seen her on 11/27/17 for poison ivy. She states she used up the cream and she still has some scars but the cream worked well. She would like to know if Allie Bossier can refill this.   triamcinolone cream (KENALOG) 0.1 %  [id number] [address], [place] [address] [place] [address] Phone: [phone] Fax: [phone]

## 2018-02-13 ENCOUNTER — Ambulatory Visit: Payer: BLUE CROSS/BLUE SHIELD | Admitting: Internal Medicine

## 2018-02-13 ENCOUNTER — Encounter: Payer: Self-pay | Admitting: Internal Medicine

## 2018-02-13 VITALS — BP 106/64 | HR 76 | Temp 97.9°F | Wt 125.0 lb

## 2018-02-13 DIAGNOSIS — R635 Abnormal weight gain: Secondary | ICD-10-CM | POA: Diagnosis not present

## 2018-02-13 DIAGNOSIS — R5383 Other fatigue: Secondary | ICD-10-CM | POA: Diagnosis not present

## 2018-02-13 DIAGNOSIS — L659 Nonscarring hair loss, unspecified: Secondary | ICD-10-CM

## 2018-02-13 LAB — VITAMIN D 25 HYDROXY (VIT D DEFICIENCY, FRACTURES): VITD: 20.35 ng/mL — ABNORMAL LOW (ref 30.00–100.00)

## 2018-02-13 LAB — FOLATE: Folate: 20.8 ng/mL (ref >5.9)

## 2018-02-13 LAB — VITAMIN B12: Vitamin B-12: 296 pg/mL (ref 211–911)

## 2018-02-13 LAB — TSH: TSH: 1.54 u[IU]/mL (ref 0.35–4.50)

## 2018-02-13 NOTE — Progress Notes (Signed)
Subjective:    Patient ID: Joanna Morgan, female    DOB: 10/23/1985, 32 y.o.   MRN: [id number]  HPI  Pt presents to the clinic today with c/o hair loss, fatigue and weight gain. She really started noticing these symptoms over the last 2 weeks. She reports her hair is coming out in clumps, but she denies bald spots. She has gained 6-7 lbs over the last year, despite change in diet or exercise regimen. She reports she is sleeping well. She would like to have her thyroid levels checked today.  Review of Systems      Past Medical History:  Diagnosis Date  . Abnormal Pap smear 04/26/11   LSIL HPV/MILD DYSPLASIA CIN1  . ADHD (attention deficit hyperactivity disorder)   . Anxiety   . Depression   . Stomach ulcer     Current Outpatient Medications  Medication Sig Dispense Refill  . norethindrone (CAMILA) 0.35 MG tablet Take 1 tablet (0.35 mg total) by mouth daily. 3 Package 3  . triamcinolone cream (KENALOG) 0.1 % Apply 1 application topically 2 (two) times daily. 30 g 0   No current facility-administered medications for this visit.     Allergies  Allergen Reactions  . Sulfa Antibiotics Shortness Of Breath  . Codeine Phosphate Rash    Family History  Problem Relation Age of Onset  . Cancer Mother        MELANOMA IN EYE  . Depression Mother   . Schizophrenia Father   . Asthma Sister   . Cancer Maternal Grandmother        brain  . Cancer Maternal Grandfather        lung  . Diabetes Paternal Grandmother   . Cancer Maternal Uncle        liver    Social History   Socioeconomic History  . Marital status: Married    Spouse name: Not on file  . Number of children: Not on file  . Years of education: Not on file  . Highest education level: Not on file  Occupational History  . Not on file  Social Needs  . Financial resource strain: Not on file  . Food insecurity:    Worry: Not on file    Inability: Not on file  . Transportation needs:    Medical: Not on file   Non-medical: Not on file  Tobacco Use  . Smoking status: Former Smoker    Last attempt to quit: 09/21/2010    Years since quitting: 7.4  . Smokeless tobacco: Never Used  Substance and Sexual Activity  . Alcohol use: Yes    Alcohol/week: 0.6 oz    Types: 1 Glasses of wine per week  . Drug use: No  . Sexual activity: Yes    Birth control/protection: Pill  Lifestyle  . Physical activity:    Days per week: Not on file    Minutes per session: Not on file  . Stress: Not on file  Relationships  . Social connections:    Talks on phone: Not on file    Gets together: Not on file    Attends religious service: Not on file    Active member of club or organization: Not on file    Attends meetings of clubs or organizations: Not on file    Relationship status: Not on file  . Intimate partner violence:    Fear of current or ex partner: Not on file    Emotionally abused: Not on file  Physically abused: Not on file    Forced sexual activity: Not on file  Other Topics Concern  . Not on file  Social History Narrative  . Not on file     Constitutional: Pt reports fatigue and weight gain. Denies fever, malaise, headache.  HEENT: Denies eye pain, eye redness, ear pain, ringing in the ears, wax buildup, runny nose, nasal congestion, bloody nose, or sore throat. Respiratory: Denies difficulty breathing, shortness of breath, cough or sputum production.   Cardiovascular: Denies chest pain, chest tightness, palpitations or swelling in the hands or feet.  Gastrointestinal: Denies abdominal pain, bloating, constipation, diarrhea or blood in the stool.  GU: Denies urgency, frequency, pain with urination, burning sensation, blood in urine, odor or discharge. Musculoskeletal: Denies decrease in range of motion, difficulty with gait, muscle pain or joint pain and swelling.  Skin: Pt reports hair loss. Denies redness, rashes, lesions or ulcercations.  Neurological: Denies dizziness, difficulty with  memory, difficulty with speech or problems with balance and coordination.  Psych: Denies anxiety, depression, SI/HI.  No other specific complaints in a complete review of systems (except as listed in HPI above).  Objective:   Physical Exam   BP 106/64   Pulse 76   Temp 97.9 F (36.6 C) (Oral)   Wt 125 lb (56.7 kg)   LMP 02/09/2018   SpO2 98%   BMI 19.58 kg/m  Wt Readings from Last 3 Encounters:  02/13/18 125 lb (56.7 kg)  11/27/17 126 lb 8 oz (57.4 kg)  03/30/16 118 lb (53.5 kg)    General: Appears her stated age, well developed, well nourished in NAD. Skin: Warm, dry and intact. No thinning of the hair noted.  Neck:  Neck supple, trachea midline. No masses, lumps or thyromegaly present.  Cardiovascular: Normal rate and rhythm. S1,S2 noted.  No murmur, rubs or gallops noted.  Pulmonary/Chest: Normal effort and positive vesicular breath sounds. No respiratory distress. No wheezes, rales or ronchi noted.  Neurological: Alert and oriented.    BMET    Component Value Date/Time   NA 143 04/18/2007 1137   K 5.0 04/18/2007 1137   CL 107 04/18/2007 1137   CO2 31 04/18/2007 1137   GLUCOSE 74 04/18/2007 1137   BUN 2 (L) 04/18/2007 1137   CREATININE 0.7 04/18/2007 1137   CALCIUM 9.5 04/18/2007 1137   GFRNONAA 115 04/18/2007 1137   GFRAA 139 04/18/2007 1137    Lipid Panel  No results found for: CHOL, TRIG, HDL, CHOLHDL, VLDL, LDLCALC  CBC    Component Value Date/Time   WBC 8.4 05/13/2015 0550   RBC 3.69 (L) 05/13/2015 0550   HGB 11.3 (L) 05/13/2015 0550   HGB 12.4 09/22/2014   HCT 33.5 (L) 05/13/2015 0550   HCT 38 09/22/2014   PLT 146 (L) 05/13/2015 0550   PLT 256 09/22/2014   MCV 90.8 05/13/2015 0550   MCH 30.6 05/13/2015 0550   MCHC 33.7 05/13/2015 0550   RDW 12.7 05/13/2015 0550   LYMPHSABS 1.4 02/25/2015 0921   MONOABS 0.6 02/25/2015 0921   EOSABS 0.2 02/25/2015 0921   BASOSABS 0.0 02/25/2015 0921    Hgb A1C No results found for: HGBA1C           Assessment & Plan:   Hair Loss, Fatigue, Abnormal Weight Gain,:  Will check TSH, B12, Folate and Vit D  If labs normal, likely hormonal Encouraged regular exercise and sleep regimen  Will follow up after labs, return precautions discussed Webb Silversmith, NP

## 2018-02-14 ENCOUNTER — Other Ambulatory Visit: Payer: Self-pay | Admitting: Internal Medicine

## 2018-02-14 ENCOUNTER — Encounter: Payer: Self-pay | Admitting: Internal Medicine

## 2018-02-14 DIAGNOSIS — E559 Vitamin D deficiency, unspecified: Secondary | ICD-10-CM

## 2018-02-14 MED ORDER — VITAMIN D (ERGOCALCIFEROL) 1.25 MG (50000 UNIT) PO CAPS: 50000.0000 [IU] | ORAL_CAPSULE | ORAL | 0 refills | Status: DC

## 2018-02-14 NOTE — Patient Instructions (Signed)

## 2018-02-28 ENCOUNTER — Ambulatory Visit (INDEPENDENT_AMBULATORY_CARE_PROVIDER_SITE_OTHER): Payer: BLUE CROSS/BLUE SHIELD

## 2018-02-28 DIAGNOSIS — E538 Deficiency of other specified B group vitamins: Secondary | ICD-10-CM | POA: Diagnosis not present

## 2018-02-28 MED ORDER — CYANOCOBALAMIN 1000 MCG/ML IJ SOLN
1000.0000 ug | Freq: Once | INTRAMUSCULAR | Status: AC
  Administered 2018-02-28: 1000 ug via INTRAMUSCULAR

## 2018-02-28 NOTE — Progress Notes (Signed)
Per orders of R Baity NP, injection of Vitamin B 12 given by Ozzie Hoyle. Patient tolerated injection well.

## 2018-04-03 ENCOUNTER — Encounter (INDEPENDENT_AMBULATORY_CARE_PROVIDER_SITE_OTHER): Payer: Self-pay

## 2018-04-03 ENCOUNTER — Ambulatory Visit (INDEPENDENT_AMBULATORY_CARE_PROVIDER_SITE_OTHER): Payer: BLUE CROSS/BLUE SHIELD | Admitting: Emergency Medicine

## 2018-04-03 DIAGNOSIS — E538 Deficiency of other specified B group vitamins: Secondary | ICD-10-CM

## 2018-04-03 MED ORDER — CYANOCOBALAMIN 1000 MCG/ML IJ SOLN
1000.0000 ug | Freq: Once | INTRAMUSCULAR | Status: AC
  Administered 2018-04-03: 1000 ug via INTRAMUSCULAR

## 2018-04-03 NOTE — Progress Notes (Signed)
Per orders of [facility] , injection of b12 given by [employer]	[place]. Patient tolerated injection well. Patient received injection in right deltoid

## 2018-05-04 ENCOUNTER — Ambulatory Visit: Payer: BLUE CROSS/BLUE SHIELD | Admitting: Family Medicine

## 2018-05-04 ENCOUNTER — Encounter: Payer: Self-pay | Admitting: Family Medicine

## 2018-05-04 ENCOUNTER — Encounter (INDEPENDENT_AMBULATORY_CARE_PROVIDER_SITE_OTHER): Payer: Self-pay

## 2018-05-04 ENCOUNTER — Ambulatory Visit: Payer: Self-pay | Admitting: *Deleted

## 2018-05-04 VITALS — BP 114/72 | HR 84 | Temp 98.0°F | Ht 67.0 in | Wt 127.5 lb

## 2018-05-04 DIAGNOSIS — J209 Acute bronchitis, unspecified: Secondary | ICD-10-CM

## 2018-05-04 MED ORDER — AZITHROMYCIN 250 MG PO TABS: ORAL_TABLET | ORAL | 0 refills | Status: DC

## 2018-05-04 NOTE — Telephone Encounter (Signed)
Pt has appt 05/04/18 at 11:15 with Dr Darnell Level.

## 2018-05-04 NOTE — Progress Notes (Signed)
BP 114/72 (BP Location: Left Arm, Patient Position: Sitting, Cuff Size: Normal)   Pulse 84   Temp 98 F (36.7 C) (Oral)   Ht 5\' 7"  (1.702 m)   Wt 127 lb 8 oz (57.8 kg)   LMP 04/17/2018   SpO2 99%   BMI 19.97 kg/m    CC: cough Subjective:    Patient ID: Joanna Morgan, female    DOB: 1985/09/03, 32 y.o.   MRN: [id number]  HPI: Joanna Morgan is a 32 y.o. female presenting on 05/04/2018 for Cough (C/o dry cough, cest congestion, nasal congestion, sinus pressure and ear pain. Sxs started 1 wk ago. Tried allergy meds, barely helpful. )   1.5 wk h/o cold sxs. Started with dry raspy throat, cough, progressed to nasal sinus congestion pressure and sneezing, now with dry cough associated with R sided chest discomfort. R earache and muffled hearing. Chest feels tight. Chest > head congestion  No fevers/chills, tooth pain, wheezing.  Coworkers sick - flu, bronchitis.   Using nasal allergy spray OTC, albuterol treatment at home yesterday with benefit. Using cough drops.  No h/o asthma.  Ex smoker - quit 8 yrs ago.   Relevant past medical, surgical, family and social history reviewed and updated as indicated. Interim medical history since our last visit reviewed. Allergies and medications reviewed and updated. Outpatient Medications Prior to Visit  Medication Sig Dispense Refill  . norethindrone (CAMILA) 0.35 MG tablet Take 1 tablet (0.35 mg total) by mouth daily. 3 Package 3  . triamcinolone cream (KENALOG) 0.1 % Apply 1 application topically 2 (two) times daily. 30 g 0  . Vitamin D, Ergocalciferol, (DRISDOL) 50000 units CAPS capsule Take 1 capsule (50,000 Units total) by mouth every 7 (seven) days. 12 capsule 0   No facility-administered medications prior to visit.      Per HPI unless specifically indicated in ROS section below Review of Systems     Objective:    BP 114/72 (BP Location: Left Arm, Patient Position: Sitting, Cuff Size: Normal)   Pulse 84   Temp 98 F (36.7 C)  (Oral)   Ht 5\' 7"  (1.702 m)   Wt 127 lb 8 oz (57.8 kg)   LMP 04/17/2018   SpO2 99%   BMI 19.97 kg/m   Wt Readings from Last 3 Encounters:  05/04/18 127 lb 8 oz (57.8 kg)  02/13/18 125 lb (56.7 kg)  11/27/17 126 lb 8 oz (57.4 kg)    Physical Exam  Constitutional: She appears well-developed and well-nourished. No distress.  HENT:  Head: Normocephalic and atraumatic.  Right Ear: Hearing, tympanic membrane, external ear and ear canal normal.  Left Ear: Hearing, tympanic membrane, external ear and ear canal normal.  Nose: Mucosal edema (nasal mucosal congestion) present. No rhinorrhea. Right sinus exhibits no maxillary sinus tenderness and no frontal sinus tenderness. Left sinus exhibits no maxillary sinus tenderness and no frontal sinus tenderness.  Mouth/Throat: Uvula is midline and mucous membranes are normal. Posterior oropharyngeal erythema (mild) present. No oropharyngeal exudate, posterior oropharyngeal edema or tonsillar abscesses.  Eyes: Pupils are equal, round, and reactive to light. Conjunctivae and EOM are normal. No scleral icterus.  Neck: Normal range of motion. Neck supple.  Cardiovascular: Normal rate, regular rhythm, normal heart sounds and intact distal pulses.  No murmur heard. Pulmonary/Chest: Effort normal and breath sounds normal. No respiratory distress. She has no wheezes. She has no rales. She exhibits no tenderness (no reproducible tenderness).  Lungs clear  Lymphadenopathy:    She  has no cervical adenopathy.  Skin: Skin is warm and dry. No rash noted.  Nursing note and vitals reviewed.     Assessment & Plan:   Problem List Items Addressed This Visit    Acute bronchitis - Primary    Anticipate acute bronchitis with pleurisy. Likely viral. Supportive care reviewed - rec ibuprofen and albuterol neb (she has this at home). WASP for zpack provided with indications when to fill. Pt agrees with plan.           Meds ordered this encounter  Medications  .  azithromycin (ZITHROMAX) 250 MG tablet    Sig: Take two tablets on day one followed by one tablet on days 2-5    Dispense:  6 each    Refill:  0   No orders of the defined types were placed in this encounter.   Follow up plan: Return if symptoms worsen or fail to improve.  Ria Bush, MD

## 2018-05-04 NOTE — Assessment & Plan Note (Signed)
Anticipate acute bronchitis with pleurisy. Likely viral. Supportive care reviewed - rec ibuprofen and albuterol neb (she has this at home). WASP for zpack provided with indications when to fill. Pt agrees with plan.

## 2018-05-04 NOTE — Patient Instructions (Signed)
I think you do have bronchitis - likely viral.  Push fluids and rest.  Take ibuprofen 400-600mg  with meals over weekend Schedule albuterol neb 2-3 times a day over weekend.  If fever >101, worsening productive cough, fill zpack antibiotic provided today.

## 2018-05-04 NOTE — Telephone Encounter (Signed)
  Reason for Disposition . Earache is present  Answer Assessment - Initial Assessment Questions 1. ONSET: "When did the cough begin?"      Past few days-dry cough 2. SEVERITY: "How bad is the cough today?"      Very irratating-  Dry cough- making chest tight 3. RESPIRATORY DISTRESS: "Describe your breathing."      Patient had to use nebulizer yesterday- chest tightness- patient can take deep breath 4. FEVER: "Do you have a fever?" If so, ask: "What is your temperature, how was it measured, and when did it start?"     no 5. HEMOPTYSIS: "Are you coughing up any blood?" If so ask: "How much?" (flecks, streaks, tablespoons, etc.)     No- color of sputum- dark yellow 6. TREATMENT: "What have you done so far to treat the cough?" (e.g., meds, fluids, humidifier)     Nebulizer once yesterday, claritin,cough drops 7. CARDIAC HISTORY: "Do you have any history of heart disease?" (e.g., heart attack, congestive heart failure)      no 8. LUNG HISTORY: "Do you have any history of lung disease?"  (e.g., pulmonary embolus, asthma, emphysema)     No- hx bronchitis  9. PE RISK FACTORS: "Do you have a history of blood clots?" (or: recent major surgery, recent prolonged travel, bedridden)     no 10. OTHER SYMPTOMS: "Do you have any other symptoms? (e.g., runny nose, wheezing, chest pain)       Ear pain, chest tightness/pain- R rib cage 11. PREGNANCY: "Is there any chance you are pregnant?" "When was your last menstrual period?"       No- LMP- 04/17/18 12. TRAVEL: "Have you traveled out of the country in the last month?" (e.g., travel history, exposures)       no  Protocols used: COUGH - ACUTE NON-PRODUCTIVE-A-AH

## 2018-05-08 ENCOUNTER — Ambulatory Visit: Payer: BLUE CROSS/BLUE SHIELD

## 2018-05-23 ENCOUNTER — Ambulatory Visit (INDEPENDENT_AMBULATORY_CARE_PROVIDER_SITE_OTHER): Payer: BLUE CROSS/BLUE SHIELD

## 2018-05-23 DIAGNOSIS — E538 Deficiency of other specified B group vitamins: Secondary | ICD-10-CM | POA: Diagnosis not present

## 2018-05-23 MED ORDER — CYANOCOBALAMIN 1000 MCG/ML IJ SOLN
1000.0000 ug | Freq: Once | INTRAMUSCULAR | Status: AC
  Administered 2018-05-23: 1000 ug via INTRAMUSCULAR

## 2018-05-23 NOTE — Progress Notes (Signed)
Patient in office today for B12 injection. Tolerated injection well to left deltoid.  

## 2018-05-29 NOTE — Progress Notes (Signed)
Pt due for B12 injection. Last B12 level reviewed. Regina Baity, NP    

## 2018-06-02 ENCOUNTER — Other Ambulatory Visit: Payer: Self-pay | Admitting: Internal Medicine

## 2018-06-02 DIAGNOSIS — E538 Deficiency of other specified B group vitamins: Secondary | ICD-10-CM

## 2018-06-27 ENCOUNTER — Ambulatory Visit (INDEPENDENT_AMBULATORY_CARE_PROVIDER_SITE_OTHER): Payer: BLUE CROSS/BLUE SHIELD | Admitting: *Deleted

## 2018-06-27 DIAGNOSIS — E538 Deficiency of other specified B group vitamins: Secondary | ICD-10-CM

## 2018-06-27 MED ORDER — CYANOCOBALAMIN 1000 MCG/ML IJ SOLN
1000.0000 ug | Freq: Once | INTRAMUSCULAR | Status: AC
  Administered 2018-06-27: 1000 ug via INTRAMUSCULAR

## 2018-06-27 NOTE — Progress Notes (Signed)
Per orders of Dr. Tower, injection of b12 given by Nerine Pulse H. Patient tolerated injection well.  

## 2018-07-13 ENCOUNTER — Other Ambulatory Visit: Payer: Self-pay | Admitting: Obstetrics & Gynecology

## 2018-07-13 DIAGNOSIS — Z3041 Encounter for surveillance of contraceptive pills: Secondary | ICD-10-CM

## 2018-08-01 ENCOUNTER — Ambulatory Visit (INDEPENDENT_AMBULATORY_CARE_PROVIDER_SITE_OTHER): Payer: BLUE CROSS/BLUE SHIELD | Admitting: *Deleted

## 2018-08-01 ENCOUNTER — Telehealth: Payer: Self-pay | Admitting: Internal Medicine

## 2018-08-01 ENCOUNTER — Other Ambulatory Visit: Payer: BLUE CROSS/BLUE SHIELD

## 2018-08-01 DIAGNOSIS — E538 Deficiency of other specified B group vitamins: Secondary | ICD-10-CM

## 2018-08-01 MED ORDER — CYANOCOBALAMIN 1000 MCG/ML IJ SOLN
1000.0000 ug | Freq: Once | INTRAMUSCULAR | Status: AC
  Administered 2018-08-01: 1000 ug via INTRAMUSCULAR

## 2018-08-01 NOTE — Telephone Encounter (Signed)
I spoke to pt and I let her know that I added her to the lab schedule to have her labs drawn before her B12 inj at the next nurse visit schedule.... patient expressed understanding and will put not in both lab and nurse visit schedule

## 2018-08-01 NOTE — Progress Notes (Signed)
Per orders of Dr. Silvio Pate, injection of b12 given by Modena Nunnery. Patient tolerated injection well.

## 2018-08-01 NOTE — Telephone Encounter (Signed)
Pt is wondering if she is supposed to have any follow up to check progress on b12 and vitamin D. States she had received supplement to take daily for vitamin D and she has finished it. Please advise.

## 2018-09-05 ENCOUNTER — Other Ambulatory Visit (INDEPENDENT_AMBULATORY_CARE_PROVIDER_SITE_OTHER): Payer: BLUE CROSS/BLUE SHIELD

## 2018-09-05 ENCOUNTER — Ambulatory Visit (INDEPENDENT_AMBULATORY_CARE_PROVIDER_SITE_OTHER): Payer: BLUE CROSS/BLUE SHIELD | Admitting: *Deleted

## 2018-09-05 DIAGNOSIS — E538 Deficiency of other specified B group vitamins: Secondary | ICD-10-CM

## 2018-09-05 DIAGNOSIS — E559 Vitamin D deficiency, unspecified: Secondary | ICD-10-CM

## 2018-09-05 MED ORDER — CYANOCOBALAMIN 1000 MCG/ML IJ SOLN
1000.0000 ug | Freq: Once | INTRAMUSCULAR | Status: AC
  Administered 2018-09-05: 1000 ug via INTRAMUSCULAR

## 2018-09-05 NOTE — Progress Notes (Signed)
Per orders of Regina Baity NP, injection of Vitamin B12 given by Regina Laws. Patient tolerated injection well. 

## 2018-09-06 LAB — VITAMIN D 25 HYDROXY (VIT D DEFICIENCY, FRACTURES): VITD: 27.25 ng/mL — ABNORMAL LOW (ref 30.00–100.00)

## 2018-09-06 LAB — VITAMIN B12: Vitamin B-12: 268 pg/mL (ref 211–911)

## 2018-09-10 ENCOUNTER — Encounter: Payer: Self-pay | Admitting: Internal Medicine

## 2018-09-10 ENCOUNTER — Ambulatory Visit: Payer: BLUE CROSS/BLUE SHIELD | Admitting: Internal Medicine

## 2018-09-10 VITALS — BP 100/60 | HR 80 | Temp 97.9°F | Ht 67.0 in | Wt 125.0 lb

## 2018-09-10 DIAGNOSIS — R05 Cough: Secondary | ICD-10-CM

## 2018-09-10 DIAGNOSIS — B349 Viral infection, unspecified: Secondary | ICD-10-CM | POA: Diagnosis not present

## 2018-09-10 DIAGNOSIS — J029 Acute pharyngitis, unspecified: Secondary | ICD-10-CM

## 2018-09-10 DIAGNOSIS — R059 Cough, unspecified: Secondary | ICD-10-CM

## 2018-09-10 LAB — POCT RAPID STREP A (OFFICE): Rapid Strep A Screen: NEGATIVE

## 2018-09-10 MED ORDER — METHYLPREDNISOLONE ACETATE 80 MG/ML IJ SUSP
80.0000 mg | Freq: Once | INTRAMUSCULAR | Status: AC
  Administered 2018-09-10: 80 mg via INTRAMUSCULAR

## 2018-09-10 NOTE — Progress Notes (Signed)
HPI  Pt presents to the clinic today with c/o sore throat, cough and chest congestion. She reports this started about 3 days ago. She has some pain with swallowing. She denies runny nose, nasal congestion or ear pain. The cough is non productive. She does not feel short of breath. She denies fever, chills or body aches. She has tried Human resources officer, [facility] and Tylenol with minimal relief. She has no history of allergies. She reports her son has pneumonia. She does not smoke.  Review of Systems      Past Medical History:  Diagnosis Date  . Abnormal Pap smear 04/26/11   LSIL HPV/MILD DYSPLASIA CIN1  . ADHD (attention deficit hyperactivity disorder)   . Anxiety   . Depression   . Stomach ulcer     Family History  Problem Relation Age of Onset  . Cancer Mother        MELANOMA IN EYE  . Depression Mother   . Schizophrenia Father   . Asthma Sister   . Cancer Maternal Grandmother        brain  . Cancer Maternal Grandfather        lung  . Diabetes Paternal Grandmother   . Cancer Maternal Uncle        liver    Social History   Socioeconomic History  . Marital status: Married    Spouse name: Not on file  . Number of children: Not on file  . Years of education: Not on file  . Highest education level: Not on file  Occupational History  . Not on file  Social Needs  . Financial resource strain: Not on file  . Food insecurity:    Worry: Not on file    Inability: Not on file  . Transportation needs:    Medical: Not on file    Non-medical: Not on file  Tobacco Use  . Smoking status: Former Smoker    Last attempt to quit: 09/21/2010    Years since quitting: 7.9  . Smokeless tobacco: Never Used  Substance and Sexual Activity  . Alcohol use: Yes    Alcohol/week: 1.0 standard drinks    Types: 1 Glasses of wine per week  . Drug use: No  . Sexual activity: Yes    Birth control/protection: Pill  Lifestyle  . Physical activity:    Days per week: Not on file    Minutes per session:  Not on file  . Stress: Not on file  Relationships  . Social connections:    Talks on phone: Not on file    Gets together: Not on file    Attends religious service: Not on file    Active member of club or organization: Not on file    Attends meetings of clubs or organizations: Not on file    Relationship status: Not on file  . Intimate partner violence:    Fear of current or ex partner: Not on file    Emotionally abused: Not on file    Physically abused: Not on file    Forced sexual activity: Not on file  Other Topics Concern  . Not on file  Social History Narrative  . Not on file    Allergies  Allergen Reactions  . Sulfa Antibiotics Shortness Of Breath  . Codeine Phosphate Rash     Constitutional:  Denies headache, fatigue, fever or abrupt weight changes.  HEENT:  Positive sore throat. Denies eye redness, eye pain, pressure behind the eyes, facial pain, nasal congestion, ear pain,  ringing in the ears, wax buildup, runny nose or bloody nose. Respiratory: Positive cough and chest congestion. Denies difficulty breathing or shortness of breath.  Cardiovascular: Denies chest pain, chest tightness, palpitations or swelling in the hands or feet.   No other specific complaints in a complete review of systems (except as listed in HPI above).  Objective:   BP 100/60   Pulse 80   Temp 97.9 F (36.6 C) (Oral)   Ht 5\' 7"  (1.702 m)   Wt 125 lb (56.7 kg)   LMP 07/15/2018   SpO2 98%   BMI 19.58 kg/m   Wt Readings from Last 3 Encounters:  05/04/18 127 lb 8 oz (57.8 kg)  02/13/18 125 lb (56.7 kg)  11/27/17 126 lb 8 oz (57.4 kg)     General: Appears her stated age, well developed, well nourished in NAD. HEENT: Head: normal shape and size, no sinus tenderness noted;  Ears: Tm's gray and intact, normal light reflex; Nose: mucosa pink and moist, septum midline; Throat/Mouth: + PND. Teeth present, mucosa erythematous and moist, no exudate noted, no lesions or ulcerations noted.   Neck: Bilateral anterior cervical lymphadenopathy.  Cardiovascular: Normal rate and rhythm. S1,S2 noted.  No murmur, rubs or gallops noted.  Pulmonary/Chest: Normal effort and positive vesicular breath sounds. No respiratory distress. No wheezes, rales or ronchi noted.       Assessment & Plan:   Sore Throat, Cough:  RST: negative Likely viral illness Get some rest and drink plenty of water Do salt water gargles for the sore throat 80 mg Depo IM today  RTC as needed or if symptoms persist.   Webb Silversmith, NP

## 2018-09-10 NOTE — Patient Instructions (Signed)
Viral Illness, Adult °Viruses are tiny germs that can get into a person's body and cause illness. There are many different types of viruses, and they cause many types of illness. Viral illnesses can range from mild to severe. They can affect various parts of the body. °Common illnesses that are caused by a virus include colds and the flu. Viral illnesses also include serious conditions such as HIV/AIDS (human immunodeficiency virus/acquired immunodeficiency syndrome). A few viruses have been linked to certain cancers. °What are the causes? °Many types of viruses can cause illness. Viruses invade cells in your body, multiply, and cause the infected cells to malfunction or die. When the cell dies, it releases more of the virus. When this happens, you develop symptoms of the illness, and the virus continues to spread to other cells. If the virus takes over the function of the cell, it can cause the cell to divide and grow out of control, as is the case when a virus causes cancer. °Different viruses get into the body in different ways. You can get a virus by: °· Swallowing food or water that is contaminated with the virus. °· Breathing in droplets that have been coughed or sneezed into the air by an infected person. °· Touching a surface that has been contaminated with the virus and then touching your eyes, nose, or mouth. °· Being bitten by an insect or animal that carries the virus. °· Having sexual contact with a person who is infected with the virus. °· Being exposed to blood or fluids that contain the virus, either through an open cut or during a transfusion. °If a virus enters your body, your body's defense system (immune system) will try to fight the virus. You may be at higher risk for a viral illness if your immune system is weak. °What are the signs or symptoms? °Symptoms vary depending on the type of virus and the location of the cells that it invades. Common symptoms of the main types of viral illnesses  include: °Cold and flu viruses °· Fever. °· Headache. °· Sore throat. °· Muscle aches. °· Nasal congestion. °· Cough. °Digestive system (gastrointestinal) viruses °· Fever. °· Abdominal pain. °· Nausea. °· Diarrhea. °Liver viruses (hepatitis) °· Loss of appetite. °· Tiredness. °· Yellowing of the skin (jaundice). °Brain and spinal cord viruses °· Fever. °· Headache. °· Stiff neck. °· Nausea and vomiting. °· Confusion or sleepiness. °Skin viruses °· Warts. °· Itching. °· Rash. °Sexually transmitted viruses °· Discharge. °· Swelling. °· Redness. °· Rash. °How is this treated? °Viruses can be difficult to treat because they live within cells. Antibiotic medicines do not treat viruses because these drugs do not get inside cells. Treatment for a viral illness may include: °· Resting and drinking plenty of fluids. °· Medicines to relieve symptoms. These can include over-the-counter medicine for pain and fever, medicines for cough or congestion, and medicines to relieve diarrhea. °· Antiviral medicines. These drugs are available only for certain types of viruses. They may help reduce flu symptoms if taken early. There are also many antiviral medicines for hepatitis and HIV/AIDS. °Some viral illnesses can be prevented with vaccinations. A common example is the flu shot. °Follow these instructions at home: °Medicines ° °· Take over-the-counter and prescription medicines only as told by your health care provider. °· If you were prescribed an antiviral medicine, take it as told by your health care provider. Do not stop taking the medicine even if you start to feel better. °· Be aware of when   antibiotics are needed and when they are not needed. Antibiotics do not treat viruses. If your health care provider thinks that you may have a bacterial infection as well as a viral infection, you may get an antibiotic. °? Do not ask for an antibiotic prescription if you have been diagnosed with a viral illness. That will not make your  illness go away faster. °? Frequently taking antibiotics when they are not needed can lead to antibiotic resistance. When this develops, the medicine no longer works against the bacteria that it normally fights. °General instructions °· Drink enough fluids to keep your urine clear or pale yellow. °· Rest as much as possible. °· Return to your normal activities as told by your health care provider. Ask your health care provider what activities are safe for you. °· Keep all follow-up visits as told by your health care provider. This is important. °How is this prevented? °Take these actions to reduce your risk of viral infection: °· Eat a healthy diet and get enough rest. °· Wash your hands often with soap and water. This is especially important when you are in public places. If soap and water are not available, use hand sanitizer. °· Avoid close contact with friends and family who have a viral illness. °· If you travel to areas where viral gastrointestinal infection is common, avoid drinking water or eating raw food. °· Keep your immunizations up to date. Get a flu shot every year as told by your health care provider. °· Do not share toothbrushes, nail clippers, razors, or needles with other people. °· Always practice safe sex. ° °Contact a health care provider if: °· You have symptoms of a viral illness that do not go away. °· Your symptoms come back after going away. °· Your symptoms get worse. °Get help right away if: °· You have trouble breathing. °· You have a severe headache or a stiff neck. °· You have severe vomiting or abdominal pain. °This information is not intended to replace advice given to you by your health care provider. Make sure you discuss any questions you have with your health care provider. °Document Released: 11/06/2015 Document Revised: 12/09/2015 Document Reviewed: 11/06/2015 °Elsevier Interactive Patient Education © 2019 [employer] ° °

## 2018-09-10 NOTE — Addendum Note (Signed)
Addended by: Emelia Salisbury C on: 09/10/2018 03:32 PM   Modules accepted: Orders

## 2018-09-10 NOTE — Addendum Note (Signed)
Addended by: Lurlean Nanny on: 09/10/2018 04:25 PM   Modules accepted: Orders

## 2018-09-12 ENCOUNTER — Telehealth: Payer: Self-pay

## 2018-09-12 MED ORDER — AZITHROMYCIN 250 MG PO TABS: ORAL_TABLET | ORAL | 0 refills | Status: DC

## 2018-09-12 NOTE — Addendum Note (Signed)
Addended by: Jearld Fenton on: 09/12/2018 01:39 PM   Modules accepted: Orders

## 2018-09-12 NOTE — Telephone Encounter (Signed)
RX for Azithromycin sent to pharmacy

## 2018-09-12 NOTE — Telephone Encounter (Signed)
Pt left v/m; pt was seen on 09/10/18 and given a shot; pt taking claritin but pt is no better; S/T feels terrible and prod cough with green/yellow phlegm. Pt wants to know what else can do or does pt need a med such as abx. Pt request cb.

## 2018-09-14 NOTE — Telephone Encounter (Signed)
Pt is aware as instructed 

## 2018-09-17 ENCOUNTER — Ambulatory Visit: Payer: BLUE CROSS/BLUE SHIELD | Admitting: Internal Medicine

## 2018-09-17 ENCOUNTER — Encounter: Payer: Self-pay | Admitting: Internal Medicine

## 2018-09-17 VITALS — BP 106/58 | HR 73 | Temp 97.9°F | Ht 67.0 in | Wt 125.1 lb

## 2018-09-17 DIAGNOSIS — E538 Deficiency of other specified B group vitamins: Secondary | ICD-10-CM | POA: Diagnosis not present

## 2018-09-17 DIAGNOSIS — R05 Cough: Secondary | ICD-10-CM

## 2018-09-17 DIAGNOSIS — R059 Cough, unspecified: Secondary | ICD-10-CM

## 2018-09-17 MED ORDER — PREDNISONE 10 MG PO TABS: ORAL_TABLET | ORAL | 0 refills | Status: DC

## 2018-09-17 MED ORDER — CYANOCOBALAMIN 1000 MCG/ML IJ SOLN
1000.0000 ug | Freq: Once | INTRAMUSCULAR | Status: AC
  Administered 2018-09-17: 1000 ug via INTRAMUSCULAR

## 2018-09-17 NOTE — Patient Instructions (Signed)

## 2018-09-17 NOTE — Progress Notes (Signed)
Acute Office Visit  Subjective:    Patient ID: Joanna Morgan, female    DOB: 1986-06-06, 33 y.o.   MRN: [id number]  Chief Complaint  Patient presents with  . Cough  . URI    HPI Patient is in today for cough and chest tightness. She was seen 3/20 for URI symptoms. She was diagnosed with a viral URI. She was advised to treat with OTC for symptoms. She called back 2 days later with no improvement. Azithromax was sent to pharmacy. She reports improvement in symptoms except the cough. She denies fever, chills or body aches. She has not taken anything OTC for her symptoms.  She would also like her B12 injection today.  Past Medical History:  Diagnosis Date  . Abnormal Pap smear 04/26/11   LSIL HPV/MILD DYSPLASIA CIN1  . ADHD (attention deficit hyperactivity disorder)   . Anxiety   . Depression   . Stomach ulcer     Past Surgical History:  Procedure Laterality Date  . NO PAST SURGERIES    . WISDOM TOOTH EXTRACTION      Family History  Problem Relation Age of Onset  . Cancer Mother        MELANOMA IN EYE  . Depression Mother   . Schizophrenia Father   . Asthma Sister   . Cancer Maternal Grandmother        brain  . Cancer Maternal Grandfather        lung  . Diabetes Paternal Grandmother   . Cancer Maternal Uncle        liver    Social History   Socioeconomic History  . Marital status: Married    Spouse name: Not on file  . Number of children: Not on file  . Years of education: Not on file  . Highest education level: Not on file  Occupational History  . Not on file  Social Needs  . Financial resource strain: Not on file  . Food insecurity:    Worry: Not on file    Inability: Not on file  . Transportation needs:    Medical: Not on file    Non-medical: Not on file  Tobacco Use  . Smoking status: Former Smoker    Last attempt to quit: 09/21/2010    Years since quitting: 7.9  . Smokeless tobacco: Never Used  Substance and Sexual Activity  . Alcohol use:  Yes    Alcohol/week: 1.0 standard drinks    Types: 1 Glasses of wine per week  . Drug use: No  . Sexual activity: Yes    Birth control/protection: Pill  Lifestyle  . Physical activity:    Days per week: Not on file    Minutes per session: Not on file  . Stress: Not on file  Relationships  . Social connections:    Talks on phone: Not on file    Gets together: Not on file    Attends religious service: Not on file    Active member of club or organization: Not on file    Attends meetings of clubs or organizations: Not on file    Relationship status: Not on file  . Intimate partner violence:    Fear of current or ex partner: Not on file    Emotionally abused: Not on file    Physically abused: Not on file    Forced sexual activity: Not on file  Other Topics Concern  . Not on file  Social History Narrative  . Not on file  Outpatient Medications Prior to Visit  Medication Sig Dispense Refill  . Cyanocobalamin 1000 MCG/ML KIT Inject 1 mL as directed every 14 (fourteen) days.    . norethindrone (MICRONOR,CAMILA,ERRIN) 0.35 MG tablet TAKE 1 TABLET BY MOUTH EVERY DAY 84 tablet 3  . triamcinolone cream (KENALOG) 0.1 % Apply 1 application topically 2 (two) times daily. 30 g 0  . Vitamin D, Ergocalciferol, (DRISDOL) 50000 units CAPS capsule Take 1 capsule (50,000 Units total) by mouth every 7 (seven) days. 12 capsule 0  . azithromycin (ZITHROMAX) 250 MG tablet Take 2 tabs today, then 1 tab daily x 4 days 6 tablet 0   No facility-administered medications prior to visit.     Allergies  Allergen Reactions  . Sulfa Antibiotics Shortness Of Breath  . Codeine Phosphate Rash    ROS       Constitutional: Denies fever, malaise, fatigue, headache or abrupt weight changes.  HEENT: Denies eye pain, eye redness, ear pain, ringing in the ears, wax buildup, runny nose, nasal congestion, bloody nose, or sore throat. Respiratory: Pt reports cough. Denies difficulty breathing, shortness of  breath,  or sputum production.   Cardiovascular: Pt reports chest tightness. Denies chest pain, palpitations or swelling in the hands or feet.   No other specific complaints in a complete review of systems (except as listed in HPI above).  Objective:    Physical Exam  BP (!) 106/58   Pulse 73   Temp 97.9 F (36.6 C)   Ht '5\' 7"'$  (1.702 m)   Wt 125 lb 1.9 oz (56.8 kg)   SpO2 100%   BMI 19.60 kg/m  Wt Readings from Last 3 Encounters:  09/17/18 125 lb 1.9 oz (56.8 kg)  09/10/18 125 lb (56.7 kg)  05/04/18 127 lb 8 oz (57.8 kg)    BP (!) 106/58   Pulse 73   Temp 97.9 F (36.6 C)   Ht '5\' 7"'$  (1.702 m)   Wt 125 lb 1.9 oz (56.8 kg)   SpO2 100%   BMI 19.60 kg/m  Wt Readings from Last 3 Encounters:  09/17/18 125 lb 1.9 oz (56.8 kg)  09/10/18 125 lb (56.7 kg)  05/04/18 127 lb 8 oz (57.8 kg)    General: Appears her stated age, well developed, well nourished in NAD. HEENT: Head: normal shape and size; Eyes: sclera white, no icterus, conjunctiva pink, PERRLA and EOMs intact; Ears: Tm's gray and intact, normal light reflex; Throat/Mouth: Teeth present, mucosa pink and moist, no exudate, lesions or ulcerations noted.  Neck:  No adenopathy noted. Cardiovascular: Normal rate and rhythm. S1,S2 noted.  No murmur, rubs or gallops noted.  Pulmonary/Chest: Normal effort and positive vesicular breath sounds. No respiratory distress. No wheezes, rales or ronchi noted.     Lab Results  Component Value Date   TSH 1.54 02/13/2018   Lab Results  Component Value Date   WBC 8.4 05/13/2015   HGB 11.3 (L) 05/13/2015   HCT 33.5 (L) 05/13/2015   MCV 90.8 05/13/2015   PLT 146 (L) 05/13/2015   Lab Results  Component Value Date   NA 143 04/18/2007   K 5.0 04/18/2007   CO2 31 04/18/2007   GLUCOSE 74 04/18/2007   BUN 2 (L) 04/18/2007   CREATININE 0.7 04/18/2007   BILITOT 0.7 04/18/2007   ALKPHOS 54 04/18/2007   AST 14 04/18/2007   ALT 15 04/18/2007   PROT 6.8 04/18/2007   ALBUMIN 3.9  04/18/2007   CALCIUM 9.5 04/18/2007      Assessment &  Plan:  Cough:  Get some rest and drink plenty of fluids RX for Pred Taper x 6 days Advised her to start Zyrtec OTC  B12 Deficiency:  B12 shot today  Return precautions discussed  Webb Silversmith, NP

## 2018-09-17 NOTE — Progress Notes (Signed)
Pre visit review using our clinic review tool, if applicable. No additional management support is needed unless otherwise documented below in the visit note. 

## 2018-09-19 ENCOUNTER — Ambulatory Visit: Payer: BLUE CROSS/BLUE SHIELD

## 2018-10-03 ENCOUNTER — Ambulatory Visit (INDEPENDENT_AMBULATORY_CARE_PROVIDER_SITE_OTHER): Payer: BLUE CROSS/BLUE SHIELD

## 2018-10-03 ENCOUNTER — Other Ambulatory Visit: Payer: Self-pay

## 2018-10-03 ENCOUNTER — Ambulatory Visit: Payer: BLUE CROSS/BLUE SHIELD

## 2018-10-03 DIAGNOSIS — E538 Deficiency of other specified B group vitamins: Secondary | ICD-10-CM

## 2018-10-03 MED ORDER — CYANOCOBALAMIN 1000 MCG/ML IJ SOLN
1000.0000 ug | Freq: Once | INTRAMUSCULAR | Status: AC
  Administered 2018-10-03: 1000 ug via INTRAMUSCULAR

## 2018-10-03 NOTE — Progress Notes (Signed)
Per orders ofRegina Baity, NP-C, injection ofB12given by Julyanna Scholle Y. Patient tolerated injection well. 

## 2018-10-10 ENCOUNTER — Ambulatory Visit: Payer: BLUE CROSS/BLUE SHIELD

## 2018-10-17 ENCOUNTER — Ambulatory Visit (INDEPENDENT_AMBULATORY_CARE_PROVIDER_SITE_OTHER): Payer: BLUE CROSS/BLUE SHIELD

## 2018-10-17 ENCOUNTER — Other Ambulatory Visit: Payer: Self-pay

## 2018-10-17 DIAGNOSIS — E538 Deficiency of other specified B group vitamins: Secondary | ICD-10-CM | POA: Diagnosis not present

## 2018-10-17 MED ORDER — CYANOCOBALAMIN 1000 MCG/ML IJ SOLN
1000.0000 ug | Freq: Once | INTRAMUSCULAR | Status: AC
  Administered 2018-10-17: 1000 ug via INTRAMUSCULAR

## 2018-10-17 NOTE — Progress Notes (Signed)
Pt given B12 injection in left deltoid. Tolerated well. Sent to [facility] in [place] Baity's absence.

## 2018-10-31 ENCOUNTER — Other Ambulatory Visit: Payer: Self-pay

## 2018-10-31 ENCOUNTER — Ambulatory Visit (INDEPENDENT_AMBULATORY_CARE_PROVIDER_SITE_OTHER): Payer: BLUE CROSS/BLUE SHIELD

## 2018-10-31 DIAGNOSIS — E538 Deficiency of other specified B group vitamins: Secondary | ICD-10-CM

## 2018-10-31 MED ORDER — CYANOCOBALAMIN 1000 MCG/ML IJ SOLN
1000.0000 ug | Freq: Once | INTRAMUSCULAR | Status: AC
  Administered 2018-10-31: 15:00:00 1000 ug via INTRAMUSCULAR

## 2018-10-31 NOTE — Progress Notes (Signed)
Per orders of NP Baity, injection of vit L27 given by Brenton Grills. Patient tolerated injection well.

## 2018-11-05 ENCOUNTER — Encounter: Payer: Self-pay | Admitting: Internal Medicine

## 2018-11-05 ENCOUNTER — Other Ambulatory Visit: Payer: Self-pay

## 2018-11-05 ENCOUNTER — Ambulatory Visit (INDEPENDENT_AMBULATORY_CARE_PROVIDER_SITE_OTHER): Payer: BLUE CROSS/BLUE SHIELD | Admitting: Internal Medicine

## 2018-11-05 DIAGNOSIS — M542 Cervicalgia: Secondary | ICD-10-CM

## 2018-11-05 DIAGNOSIS — M549 Dorsalgia, unspecified: Secondary | ICD-10-CM

## 2018-11-05 MED ORDER — ORPHENADRINE CITRATE ER 100 MG PO TB12: 100.0000 mg | ORAL_TABLET | Freq: Two times a day (BID) | ORAL | 0 refills | Status: DC

## 2018-11-05 NOTE — Patient Instructions (Signed)
Neck Exercises  Neck exercises can be important for many reasons:   They can help you to improve and maintain flexibility in your neck. This can be especially important as you age.   They can help to make your neck stronger. This can make movement easier.   They can reduce or prevent neck pain.   They may help your upper back.  Ask your health care provider which neck exercises would be best for you.  Exercises to improve neck flexibility  Neck stretch  Repeat this exercise 3-5 times.  1. Do this exercise while standing or while sitting in a chair.  2. Place your feet flat on the floor, shoulder-width apart.  3. Slowly turn your head to the right. Turn it all the way to the right so you can look over your right shoulder. Do not tilt or tip your head.  4. Hold this position for 10-30 seconds.  5. Slowly turn your head to the left, to look over your left shoulder.  6. Hold this position for 10-30 seconds.    Neck retraction  Repeat this exercise 8-10 times. Do this 3-4 times a day or as told by your health care provider.  1. Do this exercise while standing or while sitting in a sturdy chair.  2. Look straight ahead. Do not bend your neck.  3. Use your fingers to push your chin backward. Do not bend your neck for this movement. Continue to face straight ahead. If you are doing the exercise properly, you will feel a slight sensation in your throat and a stretch at the back of your neck.  4. Hold the stretch for 1-2 seconds. Relax and repeat.  Exercises to improve neck strength  Neck press  Repeat this exercise 10 times. Do it first thing in the morning and right before bed or as told by your health care provider.  1. Lie on your back on a firm bed or on the floor with a pillow under your head.  2. Use your neck muscles to push your head down on the pillow and straighten your spine.  3. Hold the position as well as you can. Keep your head facing up and your chin tucked.  4. Slowly count to 5 while holding this  position.  5. Relax for a few seconds. Then repeat.  Isometric strengthening  Do a full set of these exercises 2 times a day or as told by your health care provider.  1. Sit in a supportive chair and place your hand on your forehead.  2. Push forward with your head and neck while pushing back with your hand. Hold for 10 seconds.  3. Relax. Then repeat the exercise 3 times.  4. Next, do thesequence again, this time putting your hand against the back of your head. Use your head and neck to push backward against the hand pressure.  5. Finally, do the same exercise on either side of your head, pushing sideways against the pressure of your hand.  Prone head lifts  Repeat this exercise 5 times. Do this 2 times a day or as told by your health care provider.  1. Lie face-down, resting on your elbows so that your chest and upper back are raised.  2. Start with your head facing downward, near your chest. Position your chin either on or near your chest.  3. Slowly lift your head upward. Lift until you are looking straight ahead. Then continue lifting your head as far back as you   can stretch.  4. Hold your head up for 5 seconds. Then slowly lower it to your starting position.  Supine head lifts  Repeat this exercise 8-10 times. Do this 2 times a day or as told by your health care provider.  1. Lie on your back, bending your knees to point to the ceiling and keeping your feet flat on the floor.  2. Lift your head slowly off the floor, raising your chin toward your chest.  3. Hold for 5 seconds.  4. Relax and repeat.  Scapular retraction  Repeat this exercise 5 times. Do this 2 times a day or as told by your health care provider.  1. Stand with your arms at your sides. Look straight ahead.  2. Slowly pull both shoulders backward and downward until you feel a stretch between your shoulder blades in your upper back.  3. Hold for 10-30 seconds.  4. Relax and repeat.  Contact a health care provider if:   Your neck pain or  discomfort gets much worse when you do an exercise.   Your neck pain or discomfort does not improve within 2 hours after you exercise.  If you have any of these problems, stop exercising right away. Do not do the exercises again unless your health care provider says that you can.  Get help right away if:   You develop sudden, severe neck pain. If this happens, stop exercising right away. Do not do the exercises again unless your health care provider says that you can.  This information is not intended to replace advice given to you by your health care provider. Make sure you discuss any questions you have with your health care provider.  Document Released: 06/08/2015 Document Revised: 10/31/2017 Document Reviewed: 01/05/2015  Elsevier Interactive Patient Education  2019 [employer]

## 2018-11-05 NOTE — Progress Notes (Signed)
Virtual Visit via Video Note  I connected with Joanna Morgan on 11/05/18 at 10:00 AM EDT by a video enabled telemedicine application and verified that I am speaking with the correct person using two identifiers.   I discussed the limitations of evaluation and management by telemedicine and the availability of in person appointments. The patient expressed understanding and agreed to proceed.  Patient Location: Home Provider Location: Office  History of Present Illness:  Pt reports neck and upper back pain. This started 3 days ago. She describes the pain as soreness and tightness. The pain is worse when she tries to move her head or reaches for something. She denies numbness, tingling, weakness of the left arm. She has tried heat, Ibuprofen and Espom Salt baths with minimal relief. She denies any injury that she is aware of.    Past Medical History:  Diagnosis Date  . Abnormal Pap smear 04/26/11   LSIL HPV/MILD DYSPLASIA CIN1  . ADHD (attention deficit hyperactivity disorder)   . Anxiety   . Depression   . Stomach ulcer     Current Outpatient Medications  Medication Sig Dispense Refill  . norethindrone (MICRONOR,CAMILA,ERRIN) 0.35 MG tablet TAKE 1 TABLET BY MOUTH EVERY DAY 84 tablet 3  . predniSONE (DELTASONE) 10 MG tablet Take 3 tabs on days 1-2, take 2 tabs on days 3-4, take 1 tab on days 5-6 12 tablet 0  . triamcinolone cream (KENALOG) 0.1 % Apply 1 application topically 2 (two) times daily. 30 g 0  . Vitamin D, Ergocalciferol, (DRISDOL) 50000 units CAPS capsule Take 1 capsule (50,000 Units total) by mouth every 7 (seven) days. 12 capsule 0   No current facility-administered medications for this visit.     Allergies  Allergen Reactions  . Sulfa Antibiotics Shortness Of Breath  . Codeine Phosphate Rash    Family History  Problem Relation Age of Onset  . Cancer Mother        MELANOMA IN EYE  . Depression Mother   . Schizophrenia Father   . Asthma Sister   . Cancer  Maternal Grandmother        brain  . Cancer Maternal Grandfather        lung  . Diabetes Paternal Grandmother   . Cancer Maternal Uncle        liver    Social History   Socioeconomic History  . Marital status: Married    Spouse name: Not on file  . Number of children: Not on file  . Years of education: Not on file  . Highest education level: Not on file  Occupational History  . Not on file  Social Needs  . Financial resource strain: Not on file  . Food insecurity:    Worry: Not on file    Inability: Not on file  . Transportation needs:    Medical: Not on file    Non-medical: Not on file  Tobacco Use  . Smoking status: Former Smoker    Last attempt to quit: 09/21/2010    Years since quitting: 8.1  . Smokeless tobacco: Never Used  Substance and Sexual Activity  . Alcohol use: Yes    Alcohol/week: 1.0 standard drinks    Types: 1 Glasses of wine per week  . Drug use: No  . Sexual activity: Yes    Birth control/protection: Pill  Lifestyle  . Physical activity:    Days per week: Not on file    Minutes per session: Not on file  . Stress: Not  on file  Relationships  . Social connections:    Talks on phone: Not on file    Gets together: Not on file    Attends religious service: Not on file    Active member of club or organization: Not on file    Attends meetings of clubs or organizations: Not on file    Relationship status: Not on file  . Intimate partner violence:    Fear of current or ex partner: Not on file    Emotionally abused: Not on file    Physically abused: Not on file    Forced sexual activity: Not on file  Other Topics Concern  . Not on file  Social History Narrative  . Not on file     Constitutional: Denies fever, malaise, fatigue, headache or abrupt weight changes.  Musculoskeletal: Pt reports neck and upper back pain. Denies decrease in range of motion, difficulty with gait, or joint swelling.  Neurological: Denies numbness, tingling or weakness  of the left arm, difficulty with coordination.    No other specific complaints in a complete review of systems (except as listed in HPI above).   Wt Readings from Last 3 Encounters:  09/17/18 125 lb 1.9 oz (56.8 kg)  09/10/18 125 lb (56.7 kg)  05/04/18 127 lb 8 oz (57.8 kg)    General: Appears her stated age, well developed, well nourished in NAD. Skin: Warm, dry and intact. No rashes, redness or bruising noted of the neck. Pulmonary/Chest: Normal effort. No respiratory distress. Musculoskeletal: Decreased flexion, extension and rotation to the right of the cervical spine secondary to pain. Normal internal and external rotation of the left shoulder.  Neurological: Alert and oriented. Coordination normal.    BMET    Component Value Date/Time   NA 143 04/18/2007 1137   K 5.0 04/18/2007 1137   CL 107 04/18/2007 1137   CO2 31 04/18/2007 1137   GLUCOSE 74 04/18/2007 1137   BUN 2 (L) 04/18/2007 1137   CREATININE 0.7 04/18/2007 1137   CALCIUM 9.5 04/18/2007 1137   GFRNONAA 115 04/18/2007 1137   GFRAA 139 04/18/2007 1137    Lipid Panel  No results found for: CHOL, TRIG, HDL, CHOLHDL, VLDL, LDLCALC  CBC    Component Value Date/Time   WBC 8.4 05/13/2015 0550   RBC 3.69 (L) 05/13/2015 0550   HGB 11.3 (L) 05/13/2015 0550   HGB 12.4 09/22/2014   HCT 33.5 (L) 05/13/2015 0550   HCT 38 09/22/2014   PLT 146 (L) 05/13/2015 0550   PLT 256 09/22/2014   MCV 90.8 05/13/2015 0550   MCH 30.6 05/13/2015 0550   MCHC 33.7 05/13/2015 0550   RDW 12.7 05/13/2015 0550   LYMPHSABS 1.4 02/25/2015 0921   MONOABS 0.6 02/25/2015 0921   EOSABS 0.2 02/25/2015 0921   BASOSABS 0.0 02/25/2015 0921    Hgb A1C No results found for: HGBA1C     Assessment and Plan:  Acute Left Side Neck Pain, Upper Back Pain:  Seems like a trapezius strain Encouraged continue heat, Ibuprofen and Epsom Salt soaks Encouraged massage if able RX for Norflex 100 mg BID prn- sedation caution given  Follow Up  Instructions:    I discussed the assessment and treatment plan with the patient. The patient was provided an opportunity to ask questions and all were answered. The patient agreed with the plan and demonstrated an understanding of the instructions.   The patient was advised to call back or seek an in-person evaluation if the symptoms worsen or  if the condition fails to improve as anticipated.    Webb Silversmith, NP

## 2018-11-06 ENCOUNTER — Telehealth: Payer: Self-pay

## 2018-11-06 MED ORDER — CYCLOBENZAPRINE HCL 5 MG PO TABS: 5.0000 mg | ORAL_TABLET | Freq: Three times a day (TID) | ORAL | 0 refills | Status: DC | PRN

## 2018-11-06 NOTE — Telephone Encounter (Signed)
Pt is aware as instructed and expressed understanding 

## 2018-11-06 NOTE — Telephone Encounter (Signed)
Sent in Flexeril which is more sedating. She should start with 1 tab. If that doesn't work, she can take 2. Would also recommend Biofreeze to affected area, heat, massage and stretching.

## 2018-11-06 NOTE — Addendum Note (Signed)
Addended by: Jearld Fenton on: 11/06/2018 08:46 AM   Modules accepted: Orders

## 2018-11-06 NOTE — Telephone Encounter (Signed)
Pt left v/m; pt had virtual visit on 11/05/18; pt took norflex in AM and PM on 11/05/18 with no relief of pain. Pt continuing to use heat and ibuprofen. Pt cannot sleep due to neck and back hurting. Pt request what to do or get different med.Please advise.

## 2018-11-09 ENCOUNTER — Telehealth: Payer: Self-pay | Admitting: *Deleted

## 2018-11-09 NOTE — Telephone Encounter (Signed)
Patient left a voicemail stating that she had a virtual visit with Webb Silversmith NP Monday. Patient stated that she is taking Ibuprofen and using heat as instructed. Patient stated that she is not any better and may be somewhat worse. Patient wants a call back as what else she should do since there is no improvement?

## 2018-11-09 NOTE — Telephone Encounter (Signed)
Pt left v/m requesting update on earlier phone call; pt said she is having bad neck and shoulder muscular pain. Pt wants to know what to take for pain. Pt request cb today.

## 2018-11-09 NOTE — Telephone Encounter (Signed)
Pt will hold off until Monday, she will call me back to let me know

## 2018-11-09 NOTE — Telephone Encounter (Signed)
I mean, I can order an xray. Chiropractic care would be a consideration if she can make an appt if anyone is open. Let me know if she wants me to order xray.

## 2018-11-14 ENCOUNTER — Other Ambulatory Visit: Payer: Self-pay

## 2018-11-14 ENCOUNTER — Ambulatory Visit (INDEPENDENT_AMBULATORY_CARE_PROVIDER_SITE_OTHER): Payer: BLUE CROSS/BLUE SHIELD

## 2018-11-14 DIAGNOSIS — E538 Deficiency of other specified B group vitamins: Secondary | ICD-10-CM

## 2018-11-14 MED ORDER — CYANOCOBALAMIN 1000 MCG/ML IJ SOLN
1000.0000 ug | Freq: Once | INTRAMUSCULAR | Status: AC
  Administered 2018-11-14: 1000 ug via INTRAMUSCULAR

## 2018-11-14 NOTE — Progress Notes (Signed)
Per orders of Webb Silversmith, NP injection of Vitamin B12 given by Diamond Nickel, RN. Patient tolerated injection well.

## 2018-11-22 NOTE — Progress Notes (Signed)
On 11/14/18 Vitamin B12 administered to Left IM by this Probation officer.  Documentation error occurred when noted it as administered to Right deltoid.  Please note error for future administrations.   Next administration due in 1 month and should be given in right arm IM.

## 2018-12-19 ENCOUNTER — Other Ambulatory Visit (INDEPENDENT_AMBULATORY_CARE_PROVIDER_SITE_OTHER): Payer: BC Managed Care – PPO

## 2018-12-19 ENCOUNTER — Ambulatory Visit (INDEPENDENT_AMBULATORY_CARE_PROVIDER_SITE_OTHER): Payer: BC Managed Care – PPO

## 2018-12-19 DIAGNOSIS — E538 Deficiency of other specified B group vitamins: Secondary | ICD-10-CM

## 2018-12-19 LAB — VITAMIN B12: Vitamin B-12: 274 pg/mL (ref 211–911)

## 2018-12-19 MED ORDER — CYANOCOBALAMIN 1000 MCG/ML IJ SOLN
1000.0000 ug | Freq: Once | INTRAMUSCULAR | Status: AC
  Administered 2018-12-19: 1000 ug via INTRAMUSCULAR

## 2018-12-19 NOTE — Progress Notes (Signed)
Per orders of NP Regina Baity, injection of vit B-12 given by Kerilyn Cortner. Patient tolerated injection well.  

## 2019-01-14 ENCOUNTER — Ambulatory Visit: Payer: BC Managed Care – PPO | Admitting: Internal Medicine

## 2019-01-15 ENCOUNTER — Ambulatory Visit: Payer: Self-pay | Admitting: Internal Medicine

## 2019-01-16 ENCOUNTER — Other Ambulatory Visit: Payer: Self-pay

## 2019-01-16 ENCOUNTER — Ambulatory Visit: Payer: BC Managed Care – PPO | Admitting: Family Medicine

## 2019-01-16 ENCOUNTER — Encounter: Payer: Self-pay | Admitting: Family Medicine

## 2019-01-16 VITALS — BP 114/68 | HR 68 | Temp 98.0°F | Ht 67.0 in | Wt 124.1 lb

## 2019-01-16 DIAGNOSIS — E538 Deficiency of other specified B group vitamins: Secondary | ICD-10-CM | POA: Diagnosis not present

## 2019-01-16 DIAGNOSIS — E049 Nontoxic goiter, unspecified: Secondary | ICD-10-CM

## 2019-01-16 LAB — T4, FREE: Free T4: 0.77 ng/dL (ref 0.60–1.60)

## 2019-01-16 LAB — TSH: TSH: 1.02 u[IU]/mL (ref 0.35–4.50)

## 2019-01-16 MED ORDER — CYANOCOBALAMIN 1000 MCG/ML IJ SOLN
1000.0000 ug | Freq: Once | INTRAMUSCULAR | Status: AC
  Administered 2019-01-16: 1000 ug via INTRAMUSCULAR

## 2019-01-16 NOTE — Patient Instructions (Signed)
Labs today.  We will schedule thyroid ultrasound for evaluation of enlarging thyroid.  We will be in touch with results and plan from there.

## 2019-01-16 NOTE — Addendum Note (Signed)
Addended by: Ellamae Sia on: 01/16/2019 11:59 AM   Modules accepted: Orders

## 2019-01-16 NOTE — Assessment & Plan Note (Signed)
Tender swelling L>R thyroid over the past month. No obvious nodule. Will check TFTs including TPO antibody and thyroid US for further evaluation. Plan based on results.

## 2019-01-16 NOTE — Progress Notes (Signed)
This visit was conducted in person.  BP 114/68 (BP Location: Left Arm, Patient Position: Sitting, Cuff Size: Normal)   Pulse 68   Temp 98 F (36.7 C) (Tympanic)   Ht 5\' 7"  (1.702 m)   Wt 124 lb 1 oz (56.3 kg)   LMP 12/26/2018   SpO2 98%   BMI 19.43 kg/m    CC: check lump on neck Subjective:    Patient ID: Joanna Morgan, female    DOB: 10-11-1985, 33 y.o.   MRN: [id number]  HPI: Joanna Morgan is a 33 y.o. female presenting on 01/16/2019 for Cyst (C/o nodule on nodule on left side on left side of neck. Noticed about 1 mo ago. )   1 mo h/o L neck swelling, mild tenderness, some trouble swallowing noted initially, now better.  + cold intolerance.  Occasional palpitations.   Denies fevers/chills, heat intolerance, skin or hair changes, diarrhea/constipation, unexpected weight gain or loss, HA, dizziness.   Has had 2 pregnancies, never thyroid trouble with this.  She has started powder collagen supplement over the last month. No biotin use.   No fmhx thyroid disease.   Requests B12 shot today, h/o deficiency. Getting Q2 wks.  Lab Results  Component Value Date   VITAMINB12 274 12/19/2018   LMP - 12/26/2018. Now having more irregular periods.      Relevant past medical, surgical, family and social history reviewed and updated as indicated. Interim medical history since our last visit reviewed. Allergies and medications reviewed and updated. Outpatient Medications Prior to Visit  Medication Sig Dispense Refill  . norethindrone (MICRONOR,CAMILA,ERRIN) 0.35 MG tablet TAKE 1 TABLET BY MOUTH EVERY DAY 84 tablet 3  . cyclobenzaprine (FLEXERIL) 5 MG tablet Take 1 tablet (5 mg total) by mouth 3 (three) times daily as needed for muscle spasms. 30 tablet 0  . orphenadrine (NORFLEX) 100 MG tablet Take 1 tablet (100 mg total) by mouth 2 (two) times daily. 20 tablet 0   No facility-administered medications prior to visit.      Per HPI unless specifically indicated in ROS section  below Review of Systems Objective:    BP 114/68 (BP Location: Left Arm, Patient Position: Sitting, Cuff Size: Normal)   Pulse 68   Temp 98 F (36.7 C) (Tympanic)   Ht 5\' 7"  (1.702 m)   Wt 124 lb 1 oz (56.3 kg)   LMP 12/26/2018   SpO2 98%   BMI 19.43 kg/m   Wt Readings from Last 3 Encounters:  01/16/19 124 lb 1 oz (56.3 kg)  09/17/18 125 lb 1.9 oz (56.8 kg)  09/10/18 125 lb (56.7 kg)    Physical Exam Vitals signs and nursing note reviewed.  Constitutional:      General: She is not in acute distress.    Appearance: Normal appearance. She is not ill-appearing.  HENT:     Mouth/Throat:     Mouth: Mucous membranes are moist.     Pharynx: No posterior oropharyngeal erythema.  Eyes:     Extraocular Movements: Extraocular movements intact.     Conjunctiva/sclera: Conjunctivae normal.     Pupils: Pupils are equal, round, and reactive to light.  Neck:     Musculoskeletal: Normal range of motion and neck supple.     Thyroid: Thyroid mass (L sided), thyromegaly and thyroid tenderness present.   Cardiovascular:     Rate and Rhythm: Normal rate and regular rhythm.     Pulses: Normal pulses.     Heart sounds: Normal heart sounds.  No murmur.  Pulmonary:     Effort: Pulmonary effort is normal. No respiratory distress.     Breath sounds: Normal breath sounds. No wheezing, rhonchi or rales.  Lymphadenopathy:     Cervical: No cervical adenopathy.  Skin:    General: Skin is warm and dry.     Findings: No erythema or rash.  Neurological:     Mental Status: She is alert.  Psychiatric:        Mood and Affect: Mood normal.        Behavior: Behavior normal.       Results for orders placed or performed in visit on 12/19/18  Vitamin B12  Result Value Ref Range   Vitamin B-12 274 211 - 911 pg/mL   Assessment & Plan:   Problem List Items Addressed This Visit    Vitamin B12 deficiency    b12 shot today. Check IF Ab for pernicious anemia.       Relevant Orders   Intrinsic Factor  Antibodies   Thyroid enlargement - Primary    Tender swelling L>R thyroid over the past month. No obvious nodule. Will check TFTs including TPO antibody and thyroid US for further evaluation. Plan based on results.       Relevant Orders   TSH   T3   T4, free   Thyroid Peroxidase Antibodies (TPO) (REFL)   US THYROID       Meds ordered this encounter  Medications  . cyanocobalamin ((VITAMIN B-12)) injection 1,000 mcg   Orders Placed This Encounter  Procedures  . US THYROID    Standing Status:   Future    Standing Expiration Date:   03/18/2020    Scheduling Instructions:     ASAP please, I don't see where I can order STAT    Order Specific Question:   Reason for Exam (SYMPTOM  OR DIAGNOSIS REQUIRED)    Answer:   L>R thyroid enlargement over 1 month    Order Specific Question:   Preferred imaging location?    Answer:   [facility]  . TSH  . T3  . T4, free  . Intrinsic Factor Antibodies  . Thyroid Peroxidase Antibodies (TPO) (REFL)    Follow up plan: No follow-ups on file.  Ria Bush, MD

## 2019-01-16 NOTE — Assessment & Plan Note (Signed)
b12 shot today. Check IF Ab for pernicious anemia.

## 2019-01-17 ENCOUNTER — Ambulatory Visit
Admission: RE | Admit: 2019-01-17 | Discharge: 2019-01-17 | Disposition: A | Discharge status: Home / Self Care | Payer: BC Managed Care – PPO | Source: Ambulatory Visit | Attending: Family Medicine | Admitting: Family Medicine

## 2019-01-17 DIAGNOSIS — E049 Nontoxic goiter, unspecified: Secondary | ICD-10-CM

## 2019-01-18 ENCOUNTER — Other Ambulatory Visit: Payer: Self-pay | Admitting: Family Medicine

## 2019-01-18 DIAGNOSIS — E041 Nontoxic single thyroid nodule: Secondary | ICD-10-CM

## 2019-01-18 DIAGNOSIS — E049 Nontoxic goiter, unspecified: Secondary | ICD-10-CM

## 2019-01-21 LAB — INTRINSIC FACTOR ANTIBODIES: Intrinsic Factor: POSITIVE — AB

## 2019-01-21 LAB — THYROID PEROXIDASE ANTIBODIES (TPO) (REFL): Thyroperoxidase Ab SerPl-aCnc: 1 IU/mL (ref <9)

## 2019-01-21 LAB — T3: T3, Total: 81 ng/dL (ref 76–181)

## 2019-01-22 ENCOUNTER — Encounter: Payer: Self-pay | Admitting: Family Medicine

## 2019-01-22 DIAGNOSIS — D510 Vitamin B12 deficiency anemia due to intrinsic factor deficiency: Secondary | ICD-10-CM | POA: Insufficient documentation

## 2019-01-31 ENCOUNTER — Other Ambulatory Visit: Payer: Self-pay

## 2019-01-31 ENCOUNTER — Telehealth: Payer: Self-pay

## 2019-01-31 ENCOUNTER — Ambulatory Visit: Payer: Self-pay

## 2019-01-31 NOTE — Telephone Encounter (Signed)
[place] Night - Client Nonclinical Telephone Record AccessNurse Client [facility] Night - Client Client Site [place] Physician Webb Silversmith - NP Contact Type Call Who Is Calling Patient / Member / Family / Caregiver Caller Name Mija Effertz Phone Number [phone] Patient Name Joanna Morgan Patient DOB 18-Feb-1986 Call Type Message Only Information Provided Reason for Call Request to Reschedule Office Appointment Initial Comment Caller states that she would like to reschedule her apt for her B-12 shot this morning. Caller declined triage. Office hours provided. Additional Comment Call Closed By: Ivar Drape Transaction Date/Time: 01/31/2019 7:27:54 AM (ET)

## 2019-02-01 NOTE — Telephone Encounter (Signed)
I have called pt twice, vm is full and I cannot leave a msg.

## 2019-02-12 ENCOUNTER — Ambulatory Visit (INDEPENDENT_AMBULATORY_CARE_PROVIDER_SITE_OTHER): Payer: BC Managed Care – PPO

## 2019-02-12 DIAGNOSIS — E538 Deficiency of other specified B group vitamins: Secondary | ICD-10-CM | POA: Diagnosis not present

## 2019-02-12 MED ORDER — CYANOCOBALAMIN 1000 MCG/ML IJ SOLN
1000.0000 ug | Freq: Once | INTRAMUSCULAR | Status: AC
  Administered 2019-02-12: 1000 ug via INTRAMUSCULAR

## 2019-02-12 NOTE — Progress Notes (Signed)
Per orders of Webb Silversmith, NP, injection of Vitamin B-12 given by Larose Hires. [place] Crane, CMA, AAMA under supervision of  Diamond Nickel, RN, Administered IM to right deltoid. Patient tolerated injection well.

## 2019-02-13 ENCOUNTER — Other Ambulatory Visit: Payer: Self-pay

## 2019-02-13 ENCOUNTER — Ambulatory Visit
Admission: RE | Admit: 2019-02-13 | Discharge: 2019-02-13 | Disposition: A | Discharge status: Home / Self Care | Payer: BC Managed Care – PPO | Source: Ambulatory Visit | Attending: Family Medicine | Admitting: Family Medicine

## 2019-02-13 ENCOUNTER — Other Ambulatory Visit (HOSPITAL_COMMUNITY)
Admission: RE | Admit: 2019-02-13 | Discharge: 2019-02-13 | Disposition: A | Discharge status: Home / Self Care | Payer: BC Managed Care – PPO | Source: Ambulatory Visit | Attending: Radiology | Admitting: Radiology

## 2019-02-13 DIAGNOSIS — E049 Nontoxic goiter, unspecified: Secondary | ICD-10-CM

## 2019-02-13 DIAGNOSIS — E041 Nontoxic single thyroid nodule: Secondary | ICD-10-CM

## 2019-02-15 ENCOUNTER — Telehealth: Payer: Self-pay

## 2019-02-15 NOTE — Telephone Encounter (Signed)
Pt left v/m; pts 33 yr old son is in a preschool program and someone in his class has been dx with + covid testing and they are closing the preschool for 14 days. The + covid has not been in class since 02/08/19. Pt wants to know if she has to get tested; should pt stay out of work and quarantine for 14 days. Is not sure if pts son was in direct contact with + covid. Pt needs to let her employer know something.Please advise.

## 2019-02-16 NOTE — Telephone Encounter (Signed)
At this point, would recommend monitoring symptoms. No need to be tested if they are unsure if positive contact and no symptoms. I think she is okay to go to work if she has no symptoms. Advise her to wear a mask.

## 2019-02-18 ENCOUNTER — Other Ambulatory Visit: Payer: Self-pay | Admitting: Family Medicine

## 2019-02-18 DIAGNOSIS — C73 Malignant neoplasm of thyroid gland: Secondary | ICD-10-CM

## 2019-02-22 NOTE — Telephone Encounter (Signed)
Pt states she found out her son did not have direct contact with the positive case and everyone is doing well

## 2019-02-27 ENCOUNTER — Ambulatory Visit: Payer: Self-pay | Admitting: Surgery

## 2019-02-27 ENCOUNTER — Other Ambulatory Visit: Payer: Self-pay

## 2019-02-27 ENCOUNTER — Ambulatory Visit: Payer: BC Managed Care – PPO

## 2019-02-28 ENCOUNTER — Ambulatory Visit (INDEPENDENT_AMBULATORY_CARE_PROVIDER_SITE_OTHER): Payer: BC Managed Care – PPO | Admitting: *Deleted

## 2019-02-28 DIAGNOSIS — E538 Deficiency of other specified B group vitamins: Secondary | ICD-10-CM | POA: Diagnosis not present

## 2019-02-28 MED ORDER — CYANOCOBALAMIN 1000 MCG/ML IJ SOLN
1000.0000 ug | Freq: Once | INTRAMUSCULAR | Status: AC
  Administered 2019-02-28: 1000 ug via INTRAMUSCULAR

## 2019-02-28 NOTE — Progress Notes (Signed)
Per orders of Webb Silversmith, NP, injection of B12 given by Earleen Reaper. Patient tolerated injection well.

## 2019-03-25 ENCOUNTER — Encounter: Payer: Self-pay | Admitting: *Deleted

## 2019-03-25 ENCOUNTER — Encounter: Payer: Self-pay | Admitting: Obstetrics & Gynecology

## 2019-03-25 ENCOUNTER — Ambulatory Visit (INDEPENDENT_AMBULATORY_CARE_PROVIDER_SITE_OTHER): Payer: BC Managed Care – PPO | Admitting: Obstetrics & Gynecology

## 2019-03-25 ENCOUNTER — Other Ambulatory Visit: Payer: Self-pay

## 2019-03-25 VITALS — BP 104/67 | HR 80 | Ht 67.0 in | Wt 124.0 lb

## 2019-03-25 DIAGNOSIS — Z124 Encounter for screening for malignant neoplasm of cervix: Secondary | ICD-10-CM | POA: Diagnosis not present

## 2019-03-25 DIAGNOSIS — Z01419 Encounter for gynecological examination (general) (routine) without abnormal findings: Secondary | ICD-10-CM | POA: Diagnosis not present

## 2019-03-25 DIAGNOSIS — R102 Pelvic and perineal pain: Secondary | ICD-10-CM

## 2019-03-25 DIAGNOSIS — Z3202 Encounter for pregnancy test, result negative: Secondary | ICD-10-CM

## 2019-03-25 DIAGNOSIS — Z30430 Encounter for insertion of intrauterine contraceptive device: Secondary | ICD-10-CM

## 2019-03-25 DIAGNOSIS — Z113 Encounter for screening for infections with a predominantly sexual mode of transmission: Secondary | ICD-10-CM

## 2019-03-25 DIAGNOSIS — Z1151 Encounter for screening for human papillomavirus (HPV): Secondary | ICD-10-CM

## 2019-03-25 DIAGNOSIS — N944 Primary dysmenorrhea: Secondary | ICD-10-CM | POA: Insufficient documentation

## 2019-03-25 LAB — POCT URINE PREGNANCY: Preg Test, Ur: NEGATIVE

## 2019-03-25 MED ORDER — LEVONORGESTREL 19.5 MCG/DAY IU IUD
INTRAUTERINE_SYSTEM | Freq: Once | INTRAUTERINE | Status: AC
  Administered 2019-03-25: 16:00:00 via INTRAUTERINE

## 2019-03-25 NOTE — Patient Instructions (Addendum)
Levonorgestrel intrauterine device (IUD) What is this medicine? LEVONORGESTREL IUD (LEE voe nor jes trel) is a contraceptive (birth control) device. The device is placed inside the uterus by a healthcare professional. It is used to prevent pregnancy. This device can also be used to treat heavy bleeding that occurs during your period. This medicine may be used for other purposes; ask your health care provider or pharmacist if you have questions. COMMON BRAND NAME(S): Kyleena, LILETTA, Mirena, Skyla What should I tell my health care provider before I take this medicine? They need to know if you have any of these conditions:  abnormal Pap smear  cancer of the breast, uterus, or cervix  diabetes  endometritis  genital or pelvic infection now or in the past  have more than one sexual partner or your partner has more than one partner  heart disease  history of an ectopic or tubal pregnancy  immune system problems  IUD in place  liver disease or tumor  problems with blood clots or take blood-thinners  seizures  use intravenous drugs  uterus of unusual shape  vaginal bleeding that has not been explained  an unusual or allergic reaction to levonorgestrel, other hormones, silicone, or polyethylene, medicines, foods, dyes, or preservatives  pregnant or trying to get pregnant  breast-feeding How should I use this medicine? This device is placed inside the uterus by a health care professional. Talk to your pediatrician regarding the use of this medicine in children. Special care may be needed. Overdosage: If you think you have taken too much of this medicine contact a poison control center or emergency room at once. NOTE: This medicine is only for you. Do not share this medicine with others. What if I miss a dose? This does not apply. Depending on the brand of device you have inserted, the device will need to be replaced every 3 to 6 years if you wish to continue using this type  of birth control. What may interact with this medicine? Do not take this medicine with any of the following medications:  amprenavir  bosentan  fosamprenavir This medicine may also interact with the following medications:  aprepitant  armodafinil  barbiturate medicines for inducing sleep or treating seizures  bexarotene  boceprevir  griseofulvin  medicines to treat seizures like carbamazepine, ethotoin, felbamate, oxcarbazepine, phenytoin, topiramate  modafinil  pioglitazone  rifabutin  rifampin  rifapentine  some medicines to treat HIV infection like atazanavir, efavirenz, indinavir, lopinavir, nelfinavir, tipranavir, ritonavir  St. John's wort  warfarin This list may not describe all possible interactions. Give your health care provider a list of all the medicines, herbs, non-prescription drugs, or dietary supplements you use. Also tell them if you smoke, drink alcohol, or use illegal drugs. Some items may interact with your medicine. What should I watch for while using this medicine? Visit your doctor or health care professional for regular check ups. See your doctor if you or your partner has sexual contact with others, becomes HIV positive, or gets a sexual transmitted disease. This product does not protect you against HIV infection (AIDS) or other sexually transmitted diseases. You can check the placement of the IUD yourself by reaching up to the top of your vagina with clean fingers to feel the threads. Do not pull on the threads. It is a good habit to check placement after each menstrual period. Call your doctor right away if you feel more of the IUD than just the threads or if you cannot feel the threads at   all. The IUD may come out by itself. You may become pregnant if the device comes out. If you notice that the IUD has come out use a backup birth control method like condoms and call your health care provider. Using tampons will not change the position of the  IUD and are okay to use during your period. This IUD can be safely scanned with magnetic resonance imaging (MRI) only under specific conditions. Before you have an MRI, tell your healthcare provider that you have an IUD in place, and which type of IUD you have in place. What side effects may I notice from receiving this medicine? Side effects that you should report to your doctor or health care professional as soon as possible:  allergic reactions like skin rash, itching or hives, swelling of the face, lips, or tongue  fever, flu-like symptoms  genital sores  high blood pressure  no menstrual period for 6 weeks during use  pain, swelling, warmth in the leg  pelvic pain or tenderness  severe or sudden headache  signs of pregnancy  stomach cramping  sudden shortness of breath  trouble with balance, talking, or walking  unusual vaginal bleeding, discharge  yellowing of the eyes or skin Side effects that usually do not require medical attention (report to your doctor or health care professional if they continue or are bothersome):  acne  breast pain  change in sex drive or performance  changes in weight  cramping, dizziness, or faintness while the device is being inserted  headache  irregular menstrual bleeding within first 3 to 6 months of use  nausea This list may not describe all possible side effects. Call your doctor for medical advice about side effects. You may report side effects to FDA at 1-800-FDA-1088. Where should I keep my medicine? This does not apply. NOTE: This sheet is a summary. It may not cover all possible information. If you have questions about this medicine, talk to your doctor, pharmacist, or health care provider.  2020 Elsevier/Gold Standard (2018-05-08 13:22:01)   Intrauterine Device Insertion, Care After  This sheet gives you information about how to care for yourself after your procedure. Your health care provider may also give you  more specific instructions. If you have problems or questions, contact your health care provider. What can I expect after the procedure? After the procedure, it is common to have:  Cramps and pain in the abdomen.  Light bleeding (spotting) or heavier bleeding that is like your menstrual period. This may last for up to a few days.  Lower back pain.  Dizziness.  Headaches.  Nausea. Follow these instructions at home:  Before resuming sexual activity, check to make sure that you can feel the IUD string(s). You should be able to feel the end of the string(s) below the opening of your cervix. If your IUD string is in place, you may resume sexual activity. ? If you had a hormonal IUD inserted more than 7 days after your most recent period started, you will need to use a backup method of birth control for 7 days after IUD insertion. Ask your health care provider whether this applies to you.  Continue to check that the IUD is still in place by feeling for the string(s) after every menstrual period, or once a month.  Take over-the-counter and prescription medicines only as told by your health care provider.  Do not drive or use heavy machinery while taking prescription pain medicine.  Keep all follow-up visits as told  by your health care provider. This is important. Contact a health care provider if:  You have bleeding that is heavier or lasts longer than a normal menstrual cycle.  You have a fever.  You have cramps or abdominal pain that get worse or do not get better with medicine.  You develop abdominal pain that is new or is not in the same area of earlier cramping and pain.  You feel lightheaded or weak.  You have abnormal or bad-smelling discharge from your vagina.  You have pain during sexual activity.  You have any of the following problems with your IUD string(s): ? The string bothers or hurts you or your sexual partner. ? You cannot feel the string. ? The string has  gotten longer.  You can feel the IUD in your vagina.  You think you may be pregnant, or you miss your menstrual period.  You think you may have an STI (sexually transmitted infection). Get help right away if:  You have flu-like symptoms.  You have a fever and chills.  You can feel that your IUD has slipped out of place. Summary  After the procedure, it is common to have cramps and pain in the abdomen. It is also common to have light bleeding (spotting) or heavier bleeding that is like your menstrual period.  Continue to check that the IUD is still in place by feeling for the string(s) after every menstrual period, or once a month.  Keep all follow-up visits as told by your health care provider. This is important.  Contact your health care provider if you have problems with your IUD string(s), such as the string getting longer or bothering you or your sexual partner. This information is not intended to replace advice given to you by your health care provider. Make sure you discuss any questions you have with your health care provider. Document Released: 02/23/2011 Document Revised: 06/09/2017 Document Reviewed: 05/18/2016 Elsevier Patient Education  2020 [employer].

## 2019-03-25 NOTE — Progress Notes (Signed)
Subjective:     Joanna Morgan is a 33 y.o. female here for a routine exam. G2P2 LMP   Current complaints: pt reports dyamenorrhe and deep dyspareunia. Thinks she might have endometrioses. She has no plans to have another pregnancy.  Concerned about excess hormones.    Gynecologic History Patient's last menstrual period was 02/24/2019 (exact date). Contraception: POPs Last Pap:03/30/2016. Results were: normal Last mammogram: n/a  Obstetric History OB History  Gravida Para Term Preterm AB Living  2 2 2  0 0 2  SAB TAB Ectopic Multiple Live Births  0 0 0 0 2    # Outcome Date GA Lbr Len/2nd Weight Sex Delivery Anes PTL Lv  2 Term 05/12/15 [redacted]w[redacted]d 03:25 / 00:47 7 lb 7.8 oz (3.396 kg) M Vag-Spont EPI  LIV  1 Term 05/22/11 [redacted]w[redacted]d 13:56 / 01:55 6 lb 11.9 oz (3.06 kg) M Vag-Spont EPI  LIV    The following portions of the patient's history were reviewed and updated as appropriate: allergies, current medications, past family history, past medical history, past social history, past surgical history and problem list.  Review of Systems Pertinent items are noted in HPI.    Objective:  BP 104/67   Pulse 80   Ht 5\' 7"  (1.702 m)   Wt 124 lb (56.2 kg)   LMP 02/24/2019 (Exact Date)   BMI 19.42 kg/m   General Appearance:    Alert, cooperative, no distress, appears stated age  Head:    Normocephalic, without obvious abnormality, atraumatic  Eyes:    conjunctiva/corneas clear, EOM's intact, both eyes  Ears:    Normal external ear canals, both ears  Nose:   Nares normal, septum midline, mucosa normal, no drainage    or sinus tenderness  Throat:   Lips, mucosa, and tongue normal; teeth and gums normal  Neck:   Supple, symmetrical, trachea midline, no adenopathy;    thyroid:  no enlargement/tenderness/nodules  Back:     Symmetric, no curvature, ROM normal, no CVA tenderness  Lungs:     respirations unlabored  Chest Wall:    No tenderness or deformity   Heart:    Regular rate and rhythm  Breast  Exam:    No tenderness, masses, or nipple abnormality  Abdomen:     Soft, non-tender, bowel sounds active all four quadrants,    no masses, no organomegaly  Genitalia:    Normal female without lesion, discharge or tenderness     Extremities:   Extremities normal, atraumatic, no cyanosis or edema  Pulses:   2+ and symmetric all extremities  Skin:   Skin color, texture, turgor normal, no rashes or lesions   GYNECOLOGY CLINIC PROCEDURE NOTE  Joanna Morgan is a 33 y.o. [id number] here for Mirena IUD insertion.  IUD Insertion Procedure Note Patient identified, informed consent performed.  Discussed risks of irregular bleeding, cramping, infection, malpositioning or misplacement of the IUD outside the uterus which may require further procedures. Time out was performed.  Urine pregnancy test negative.  Speculum placed in the vagina.  Cervix visualized.  Cleaned with Betadine x 2.  Grasped anteriorly with a single tooth tenaculum.  Uterus sounded to 9 cm. Lilttta  IUD placed per manufacturer's recommendations.  Strings trimmed to 3 cm. Tenaculum was removed, good hemostasis noted.  Patient tolerated procedure well.    Assessment:    Healthy female exam.  Pelvic pain reviewed management options.   Pt elected for LnIUd. Reviewed possible se.   Plan:    Follow  up in: 4 week.    F/u PAP  Patient was given post-procedure instructions.  Patient was asked to follow up in 4 weeks for IUD check.  Gwendolynn Merkey L. Harraway-Smith, M.D., Cherlynn June

## 2019-03-25 NOTE — Progress Notes (Signed)
Last pap  03/2016-normal Recently diagnosed with Thyroid cancer, has surgery scheduled 10/1 to remove it Waiting on Flu shot

## 2019-03-28 LAB — CYTOLOGY - PAP
Chlamydia: NEGATIVE
Diagnosis: NEGATIVE
HPV: NOT DETECTED
Neisseria Gonorrhea: NEGATIVE
Trichomonas: NEGATIVE

## 2019-04-01 NOTE — Patient Instructions (Addendum)
DUE TO COVID-19 ONLY ONE VISITOR IS ALLOWED TO COME WITH YOU AND STAY IN THE WAITING ROOM ONLY DURING PRE OP AND PROCEDURE DAY OF SURGERY. THE 1 VISITOR MAY VISIT WITH YOU AFTER SURGERY IN YOUR PRIVATE ROOM DURING VISITING HOURS ONLY!  YOU NEED TO HAVE A COVID 19 TEST ON__Monday 09/28/2020_____ @_0815  am______, THIS TEST MUST BE DONE BEFORE SURGERY, COME  [address], [place] , [address].  ([place]) ONCE YOUR COVID TEST IS COMPLETED, PLEASE BEGIN THE QUARANTINE INSTRUCTIONS AS OUTLINED IN YOUR HANDOUT.                TIYANNA ALERS  04/01/2019   Your procedure is scheduled on: Thursday 04/11/2019   Report to [facility] Main  Entrance              Report to Short Stay at  [place] AM     Call this number if you have problems the morning of surgery [phone]    Remember: Do not eat food or drink liquids :After Midnight.               BRUSH YOUR TEETH MORNING OF SURGERY AND RINSE YOUR MOUTH OUT, NO CHEWING GUM CANDY OR MINTS.     Take these medicines the morning of surgery with A SIP OF WATER: none                                 You may not have any metal on your body including hair pins and              piercings  Do not wear jewelry, make-up, lotions, powders or perfumes, deodorant             Do not wear nail polish.  Do not shave  48 hours prior to surgery.                Do not bring valuables to the hospital. [place].  Contacts, dentures or bridgework may not be worn into surgery.  Leave suitcase in the car. After surgery it may be brought to your room.                    Please read over the following fact sheets you were given: _____________________________________________________________________             [facility] - Preparing for Surgery Before surgery, you can play an important role.  Because skin is not sterile, your skin needs to be as free of germs as possible.  You  can reduce the number of germs on your skin by washing with CHG (chlorahexidine gluconate) soap before surgery.  CHG is an antiseptic cleaner which kills germs and bonds with the skin to continue killing germs even after washing. Please DO NOT use if you have an allergy to CHG or antibacterial soaps.  If your skin becomes reddened/irritated stop using the CHG and inform your nurse when you arrive at Short Stay. Do not shave (including legs and underarms) for at least 48 hours prior to the first CHG shower.  You may shave your face/neck. Please follow these instructions carefully:  1.  Shower with CHG Soap the night before surgery and the  morning of Surgery.  2.  If you choose to wash  your hair, wash your hair first as usual with your  normal  shampoo.  3.  After you shampoo, rinse your hair and body thoroughly to remove the  shampoo.                           4.  Use CHG as you would any other liquid soap.  You can apply chg directly  to the skin and wash                       Gently with a scrungie or clean washcloth.  5.  Apply the CHG Soap to your body ONLY FROM THE NECK DOWN.   Do not use on face/ open                           Wound or open sores. Avoid contact with eyes, ears mouth and genitals (private parts).                       Wash face,  Genitals (private parts) with your normal soap.             6.  Wash thoroughly, paying special attention to the area where your surgery  will be performed.  7.  Thoroughly rinse your body with warm water from the neck down.  8.  DO NOT shower/wash with your normal soap after using and rinsing off  the CHG Soap.                9.  Pat yourself dry with a clean towel.            10.  Wear clean pajamas.            11.  Place clean sheets on your bed the night of your first shower and do not  sleep with pets. Day of Surgery : Do not apply any lotions/deodorants the morning of surgery.  Please wear clean clothes to the hospital/surgery center.  FAILURE  TO FOLLOW THESE INSTRUCTIONS MAY RESULT IN THE CANCELLATION OF YOUR SURGERY PATIENT SIGNATURE_________________________________  NURSE SIGNATURE__________________________________  ________________________________________________________________________

## 2019-04-02 ENCOUNTER — Encounter (HOSPITAL_COMMUNITY)
Admission: RE | Admit: 2019-04-02 | Discharge: 2019-04-02 | Disposition: A | Discharge status: Home / Self Care | Payer: BC Managed Care – PPO | Source: Ambulatory Visit | Attending: Surgery | Admitting: Surgery

## 2019-04-02 ENCOUNTER — Encounter (HOSPITAL_COMMUNITY): Payer: Self-pay

## 2019-04-02 ENCOUNTER — Ambulatory Visit (HOSPITAL_COMMUNITY)
Admission: RE | Admit: 2019-04-02 | Discharge: 2019-04-02 | Disposition: A | Discharge status: Home / Self Care | Payer: BC Managed Care – PPO | Source: Ambulatory Visit | Attending: Anesthesiology | Admitting: Anesthesiology

## 2019-04-02 ENCOUNTER — Other Ambulatory Visit: Payer: Self-pay

## 2019-04-02 DIAGNOSIS — Z01811 Encounter for preprocedural respiratory examination: Secondary | ICD-10-CM | POA: Diagnosis not present

## 2019-04-02 HISTORY — DX: Headache, unspecified: R51.9

## 2019-04-02 LAB — CBC
HCT: 40.6 % (ref 36.0–46.0)
Hemoglobin: 13.1 g/dL (ref 12.0–15.0)
MCH: 30.8 pg (ref 26.0–34.0)
MCHC: 32.3 g/dL (ref 30.0–36.0)
MCV: 95.5 fL (ref 80.0–100.0)
Platelets: 216 10*3/uL (ref 150–400)
RBC: 4.25 MIL/uL (ref 3.87–5.11)
RDW: 11.9 % (ref 11.5–15.5)
WBC: 5.4 10*3/uL (ref 4.0–10.5)
nRBC: 0 % (ref 0.0–0.2)

## 2019-04-02 LAB — BASIC METABOLIC PANEL
Anion gap: 7 (ref 5–15)
BUN: 13 mg/dL (ref 6–20)
CO2: 26 mmol/L (ref 22–32)
Calcium: 9.1 mg/dL (ref 8.9–10.3)
Chloride: 103 mmol/L (ref 98–111)
Creatinine, Ser: 0.73 mg/dL (ref 0.44–1.00)
GFR calc Af Amer: >60 mL/min (ref >60)
GFR calc non Af Amer: >60 mL/min (ref >60)
Glucose, Bld: 85 mg/dL (ref 70–99)
Potassium: 4.2 mmol/L (ref 3.5–5.1)
Sodium: 136 mmol/L (ref 135–145)

## 2019-04-02 NOTE — Progress Notes (Signed)
PCP - Webb Silversmith, NP Cardiologist - N/A  Chest x-ray - 04/02/2019 EKG - N/A Stress Test - N/A ECHO - N/A Cardiac Cath - N/A  Sleep Study - N/A CPAP -   Fasting Blood Sugar - N/A Checks Blood Sugar _____ times a day  Blood Thinner Instructions:Aspirin Instructions: Last Dose:N/A  Anesthesia review:   Patient denies shortness of breath, fever, cough and chest pain at PAT appointment   Patient verbalized understanding of instructions that were given to them at the PAT appointment. Patient was also instructed that they will need to review over the PAT instructions again at home before surgery.

## 2019-04-05 ENCOUNTER — Telehealth: Payer: Self-pay | Admitting: *Deleted

## 2019-04-05 NOTE — Telephone Encounter (Signed)
Pt informed of Pap results.

## 2019-04-08 ENCOUNTER — Other Ambulatory Visit (HOSPITAL_COMMUNITY)
Admission: RE | Admit: 2019-04-08 | Discharge: 2019-04-08 | Disposition: A | Discharge status: Home / Self Care | Payer: BC Managed Care – PPO | Source: Ambulatory Visit | Attending: Surgery | Admitting: Surgery

## 2019-04-08 DIAGNOSIS — Z20828 Contact with and (suspected) exposure to other viral communicable diseases: Secondary | ICD-10-CM | POA: Diagnosis not present

## 2019-04-08 DIAGNOSIS — Z01812 Encounter for preprocedural laboratory examination: Secondary | ICD-10-CM | POA: Diagnosis present

## 2019-04-09 LAB — NOVEL CORONAVIRUS, NAA (HOSP ORDER, SEND-OUT TO REF LAB; TAT 18-24 HRS): SARS-CoV-2, NAA: NOT DETECTED

## 2019-04-10 ENCOUNTER — Encounter (HOSPITAL_COMMUNITY): Payer: Self-pay | Admitting: Surgery

## 2019-04-10 DIAGNOSIS — C73 Malignant neoplasm of thyroid gland: Secondary | ICD-10-CM | POA: Diagnosis present

## 2019-04-10 NOTE — H&P (Signed)
General Surgery [facility]  Joanna Morgan DOB: Mar 31, 1986 Married / Language: English / Race: White Female   History of Present Illness   The patient is a 33 year old female who presents with thyroid cancer.  CHIEF COMPLAINT: papillary thyroid carcinoma  Patient is referred by her primary care physician, Dr. Ria Bush, and Webb Silversmith, NP, for surgical evaluation and management of newly diagnosed papillary thyroid carcinoma. Patient had noted a nodule in the left neck on self-examination approximately 1 month ago. She was seen by her primary care providers and an ultrasound was performed on January 17, 2019. This showed a dominant nodule in the mid right thyroid lobe measuring 2.3 cm in size and a dominant nodule in the upper left thyroid lobe measuring 2.6 cm in size. Biopsy of both nodules was recommended. Patient underwent fine-needle aspiration biopsy on February 13, 2019. The nodule on the right was benign. The nodule on the left was positive for papillary thyroid carcinoma. Patient was referred for surgical evaluation and management. TSH level was normal at 1.02. Patient has had no prior head or neck surgery. There is no family history of thyroid disease or other endocrine neoplasm. Patient has never been on thyroid medication. She denies any compressive symptoms. She is married with children. 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   Past Surgical History Oral Surgery   Diagnostic Studies History  Mammogram  never Pap Smear  1-5 years ago  Allergies Codeine Phosphate *ANALGESICS - OPIOID*  Sulfa Antibiotics  Allergies Reconciled   Medication History Norethindrone (0.35MG  Tablet, Oral) Active. Medications Reconciled  Social History Alcohol use  Occasional alcohol use. Caffeine use  Coffee, Tea. No drug use  Tobacco use  Former smoker.  Family History  Depression  Mother. Melanoma  Mother.  Pregnancy / Birth  History Contraceptive History  Oral contraceptives. Gravida  2 Length (months) of breastfeeding  12-24 Maternal age  29-25  Review of Systems General Not Present- Appetite Loss, Chills, Fatigue, Fever, Night Sweats, Weight Gain and Weight Loss. Skin Not Present- Change in Wart/Mole, Dryness, Hives, Jaundice, New Lesions, Non-Healing Wounds, Rash and Ulcer. Cardiovascular Not Present- Chest Pain, Difficulty Breathing Lying Down, Leg Cramps, Palpitations, Rapid Heart Rate, Shortness of Breath and Swelling of Extremities.  Vitals Weight: 123 lb Height: 67in Body Surface Area: 1.64 m Body Mass Index: 19.26 kg/m  Temp.: 98.28F  Pulse: 71 (Regular)  BP: 118/72(Sitting, Left Arm, Standard)  Physical Exam  See vital signs recorded above  GENERAL APPEARANCE Development: normal Nutritional status: normal Gross deformities: none  SKIN Rash, lesions, ulcers: none Induration, erythema: none Nodules: none palpable  EYES Conjunctiva and lids: normal Pupils: equal and reactive Iris: normal bilaterally  EARS, NOSE, MOUTH, THROAT External ears: no lesion or deformity External nose: no lesion or deformity Hearing: grossly normal Patient is wearing a mask.  NECK Symmetric: no Trachea: midline Thyroid: Palpation of the right thyroid lobe shows a smooth mass in the mid right thyroid lobe measuring approximately 2 cm in size. This is nontender. It is mobile with swallowing. Palpation of the left thyroid lobe shows a slightly larger mass in the upper pole of the left lobe measuring approximately 2.5 cm in size. This is somewhat more firm. It is nontender. There does not appear to be any associated lymphadenopathy.  CHEST Respiratory effort: normal Retraction or accessory muscle use: no Breath sounds: normal bilaterally Rales, rhonchi, wheeze: none  CARDIOVASCULAR Auscultation: regular rhythm, normal rate Murmurs: none Pulses: carotid and  radial pulse 2+  palpable Lower extremity edema: none Lower extremity varicosities: none  MUSCULOSKELETAL Station and gait: normal Digits and nails: no clubbing or cyanosis Muscle strength: grossly normal all extremities Range of motion: grossly normal all extremities Deformity: none  LYMPHATIC Cervical: none palpable Supraclavicular: none palpable  PSYCHIATRIC Oriented to person, place, and time: yes Mood and affect: normal for situation Judgment and insight: appropriate for situation    Assessment & Plan  PAPILLARY THYROID CARCINOMA (C73) MULTIPLE THYROID NODULES (E04.2)  Pt Education - Pamphlet Given - The Thyroid Book: discussed with patient and provided information. Patient presents on referral from her primary care providers for evaluation of newly diagnosed papillary thyroid carcinoma. Patient is provided with written literature on thyroid surgery to review at home.  Patient has bilateral thyroid nodules. There is a papillary thyroid carcinoma measuring 2.6 cm in the superior pole of the left thyroid lobe. We reviewed the fine-needle aspiration biopsy cytopathology reports. I have recommended proceeding with total thyroidectomy with limited central compartment lymph node dissection. We have discussed the procedure at length. We discussed risk and benefits of surgery. We have discussed the possibility of recurrent laryngeal nerve injury and injury to parathyroid glands. We have discussed the location and size of the anterior neck incision. We have discussed the hospital stay and the postoperative recovery and return to work and activity. We have discussed the need for lifelong thyroid hormone replacement. We have discussed the potential need for radioactive iodine treatment. The patient understands all of these issues and wishes to proceed with surgery in the near future.  The risks and benefits of the procedure have been discussed at length with the patient. The patient understands  the proposed procedure, potential alternative treatments, and the course of recovery to be expected. All of the patient's questions have been answered at this time. The patient wishes to proceed with surgery.   Armandina Gemma, [place] [facility] Office: [phone]

## 2019-04-10 NOTE — Anesthesia Preprocedure Evaluation (Addendum)
Anesthesia Evaluation  Patient identified by MRN, date of birth, ID band Patient awake    Reviewed: Allergy & Precautions, NPO status , Patient's Chart, lab work & pertinent test results  Airway Mallampati: I       Dental no notable dental hx. (+) Teeth Intact   Pulmonary former smoker,    Pulmonary exam normal breath sounds clear to auscultation       Cardiovascular negative cardio ROS Normal cardiovascular exam Rhythm:Regular Rate:Normal     Neuro/Psych    GI/Hepatic Neg liver ROS,   Endo/Other    Renal/GU negative Renal ROS  negative genitourinary   Musculoskeletal negative musculoskeletal ROS (+)   Abdominal Normal abdominal exam  (+)   Peds  Hematology   Anesthesia Other Findings   Reproductive/Obstetrics                            Anesthesia Physical Anesthesia Plan  ASA: II  Anesthesia Plan: General   Post-op Pain Management:    Induction: Intravenous  PONV Risk Score and Plan: 4 or greater and Ondansetron, Dexamethasone, Midazolam and Scopolamine patch - Pre-op  Airway Management Planned: Oral ETT  Additional Equipment: None  Intra-op Plan:   Post-operative Plan: Extubation in OR  Informed Consent: I have reviewed the patients History and Physical, chart, labs and discussed the procedure including the risks, benefits and alternatives for the proposed anesthesia with the patient or authorized representative who has indicated his/her understanding and acceptance.     Dental advisory given  Plan Discussed with: CRNA  Anesthesia Plan Comments:        Anesthesia Quick Evaluation

## 2019-04-11 ENCOUNTER — Ambulatory Visit (HOSPITAL_COMMUNITY): Payer: BC Managed Care – PPO

## 2019-04-11 ENCOUNTER — Other Ambulatory Visit: Payer: Self-pay

## 2019-04-11 ENCOUNTER — Encounter (HOSPITAL_COMMUNITY): Payer: Self-pay

## 2019-04-11 ENCOUNTER — Encounter (HOSPITAL_COMMUNITY)
Admission: RE | Disposition: A | Discharge status: Home / Self Care | Payer: Self-pay | Source: Home / Self Care | Attending: Surgery

## 2019-04-11 ENCOUNTER — Observation Stay (HOSPITAL_COMMUNITY)
Admission: RE | Admit: 2019-04-11 | Discharge: 2019-04-12 | Disposition: A | Discharge status: Home / Self Care | Payer: BC Managed Care – PPO | Attending: Surgery | Admitting: Surgery

## 2019-04-11 DIAGNOSIS — Z87891 Personal history of nicotine dependence: Secondary | ICD-10-CM | POA: Insufficient documentation

## 2019-04-11 DIAGNOSIS — C73 Malignant neoplasm of thyroid gland: Secondary | ICD-10-CM | POA: Diagnosis not present

## 2019-04-11 HISTORY — PX: THYROIDECTOMY: SHX17

## 2019-04-11 LAB — PREGNANCY, URINE: Preg Test, Ur: NEGATIVE

## 2019-04-11 SURGERY — THYROIDECTOMY
Anesthesia: General | Site: Neck

## 2019-04-11 MED ORDER — PROPOFOL 10 MG/ML IV BOLUS
INTRAVENOUS | Status: DC | PRN
  Administered 2019-04-11: 30 mg via INTRAVENOUS
  Administered 2019-04-11: 150 mg via INTRAVENOUS

## 2019-04-11 MED ORDER — LIDOCAINE 2% (20 MG/ML) 5 ML SYRINGE
INTRAMUSCULAR | Status: DC | PRN
  Administered 2019-04-11: 40 mg via INTRAVENOUS
  Administered 2019-04-11: 100 mg via INTRAVENOUS

## 2019-04-11 MED ORDER — DEXAMETHASONE SODIUM PHOSPHATE 10 MG/ML IJ SOLN
INTRAMUSCULAR | Status: DC | PRN
  Administered 2019-04-11: 10 mg via INTRAVENOUS

## 2019-04-11 MED ORDER — MIDAZOLAM HCL 2 MG/2ML IJ SOLN
INTRAMUSCULAR | Status: DC | PRN
  Administered 2019-04-11: 2 mg via INTRAVENOUS

## 2019-04-11 MED ORDER — CHLORHEXIDINE GLUCONATE CLOTH 2 % EX PADS: 6.0000 | MEDICATED_PAD | Freq: Once | CUTANEOUS | Status: DC

## 2019-04-11 MED ORDER — HYDROMORPHONE HCL 1 MG/ML IJ SOLN
0.2500 mg | INTRAMUSCULAR | Status: DC | PRN
  Administered 2019-04-11 (×2): 0.25 mg via INTRAVENOUS
  Administered 2019-04-11: 0.5 mg via INTRAVENOUS

## 2019-04-11 MED ORDER — HYDROMORPHONE HCL 1 MG/ML IJ SOLN
INTRAMUSCULAR | Status: AC
  Filled 2019-04-11: qty 1

## 2019-04-11 MED ORDER — ONDANSETRON HCL 4 MG/2ML IJ SOLN
4.0000 mg | Freq: Four times a day (QID) | INTRAMUSCULAR | Status: DC | PRN
  Administered 2019-04-11: 4 mg via INTRAVENOUS
  Filled 2019-04-11: qty 2

## 2019-04-11 MED ORDER — ROCURONIUM BROMIDE 10 MG/ML (PF) SYRINGE
PREFILLED_SYRINGE | INTRAVENOUS | Status: AC
  Filled 2019-04-11: qty 30

## 2019-04-11 MED ORDER — MIDAZOLAM HCL 2 MG/2ML IJ SOLN
INTRAMUSCULAR | Status: AC
  Filled 2019-04-11: qty 2

## 2019-04-11 MED ORDER — SUGAMMADEX SODIUM 200 MG/2ML IV SOLN
INTRAVENOUS | Status: DC | PRN
  Administered 2019-04-11: 250 mg via INTRAVENOUS

## 2019-04-11 MED ORDER — ALBUTEROL SULFATE HFA 108 (90 BASE) MCG/ACT IN AERS
INHALATION_SPRAY | RESPIRATORY_TRACT | Status: AC
  Filled 2019-04-11: qty 6.7

## 2019-04-11 MED ORDER — SUCCINYLCHOLINE CHLORIDE 200 MG/10ML IV SOSY
PREFILLED_SYRINGE | INTRAVENOUS | Status: AC
  Filled 2019-04-11: qty 20

## 2019-04-11 MED ORDER — TRAMADOL HCL 50 MG PO TABS
50.0000 mg | ORAL_TABLET | Freq: Four times a day (QID) | ORAL | Status: DC | PRN
  Administered 2019-04-11: 50 mg via ORAL
  Filled 2019-04-11: qty 1

## 2019-04-11 MED ORDER — FENTANYL CITRATE (PF) 250 MCG/5ML IJ SOLN
INTRAMUSCULAR | Status: AC
  Filled 2019-04-11: qty 5

## 2019-04-11 MED ORDER — ACETAMINOPHEN 325 MG PO TABS: 650.0000 mg | ORAL_TABLET | Freq: Four times a day (QID) | ORAL | Status: DC | PRN

## 2019-04-11 MED ORDER — PHENYLEPHRINE 40 MCG/ML (10ML) SYRINGE FOR IV PUSH (FOR BLOOD PRESSURE SUPPORT)
PREFILLED_SYRINGE | INTRAVENOUS | Status: AC
  Filled 2019-04-11: qty 10

## 2019-04-11 MED ORDER — ROCURONIUM BROMIDE 10 MG/ML (PF) SYRINGE
PREFILLED_SYRINGE | INTRAVENOUS | Status: DC | PRN
  Administered 2019-04-11: 5 mg via INTRAVENOUS
  Administered 2019-04-11: 10 mg via INTRAVENOUS
  Administered 2019-04-11: 50 mg via INTRAVENOUS

## 2019-04-11 MED ORDER — ALBUTEROL SULFATE HFA 108 (90 BASE) MCG/ACT IN AERS
INHALATION_SPRAY | RESPIRATORY_TRACT | Status: DC | PRN
  Administered 2019-04-11 (×4): 2 via RESPIRATORY_TRACT

## 2019-04-11 MED ORDER — LIDOCAINE 2% (20 MG/ML) 5 ML SYRINGE
INTRAMUSCULAR | Status: AC
  Filled 2019-04-11: qty 10

## 2019-04-11 MED ORDER — ONDANSETRON HCL 4 MG/2ML IJ SOLN
INTRAMUSCULAR | Status: DC | PRN
  Administered 2019-04-11 (×2): 4 mg via INTRAVENOUS

## 2019-04-11 MED ORDER — LACTATED RINGERS IV SOLN
INTRAVENOUS | Status: DC
  Administered 2019-04-11 (×2): via INTRAVENOUS

## 2019-04-11 MED ORDER — KCL IN DEXTROSE-NACL 20-5-0.45 MEQ/L-%-% IV SOLN
INTRAVENOUS | Status: DC
  Administered 2019-04-11: 13:00:00 via INTRAVENOUS
  Filled 2019-04-11 (×2): qty 1000

## 2019-04-11 MED ORDER — EPHEDRINE 5 MG/ML INJ
INTRAVENOUS | Status: AC
  Filled 2019-04-11: qty 20

## 2019-04-11 MED ORDER — SCOPOLAMINE 1 MG/3DAYS TD PT72
MEDICATED_PATCH | TRANSDERMAL | Status: AC
  Filled 2019-04-11: qty 1

## 2019-04-11 MED ORDER — KETOROLAC TROMETHAMINE 30 MG/ML IJ SOLN
INTRAMUSCULAR | Status: AC
  Administered 2019-04-11: 30 mg via INTRAVENOUS
  Filled 2019-04-11: qty 1

## 2019-04-11 MED ORDER — HYDROMORPHONE HCL 1 MG/ML IJ SOLN
INTRAMUSCULAR | Status: AC
  Administered 2019-04-11: 0.25 mg via INTRAVENOUS
  Filled 2019-04-11: qty 1

## 2019-04-11 MED ORDER — FENTANYL CITRATE (PF) 250 MCG/5ML IJ SOLN
INTRAMUSCULAR | Status: DC | PRN
  Administered 2019-04-11: 100 ug via INTRAVENOUS
  Administered 2019-04-11: 50 ug via INTRAVENOUS
  Administered 2019-04-11: 100 ug via INTRAVENOUS
  Administered 2019-04-11 (×3): 50 ug via INTRAVENOUS
  Administered 2019-04-11: 100 ug via INTRAVENOUS

## 2019-04-11 MED ORDER — KETOROLAC TROMETHAMINE 30 MG/ML IJ SOLN
30.0000 mg | Freq: Once | INTRAMUSCULAR | Status: AC | PRN
  Administered 2019-04-11: 10:00:00 30 mg via INTRAVENOUS

## 2019-04-11 MED ORDER — PROMETHAZINE HCL 25 MG/ML IJ SOLN: 6.2500 mg | INTRAMUSCULAR | Status: DC | PRN

## 2019-04-11 MED ORDER — HYDROMORPHONE HCL 1 MG/ML IJ SOLN
1.0000 mg | INTRAMUSCULAR | Status: DC | PRN
  Administered 2019-04-11: 1 mg via INTRAVENOUS
  Filled 2019-04-11: qty 1

## 2019-04-11 MED ORDER — ONDANSETRON 4 MG PO TBDP: 4.0000 mg | ORAL_TABLET | Freq: Four times a day (QID) | ORAL | Status: DC | PRN

## 2019-04-11 MED ORDER — DEXAMETHASONE SODIUM PHOSPHATE 10 MG/ML IJ SOLN
INTRAMUSCULAR | Status: AC
  Filled 2019-04-11: qty 1

## 2019-04-11 MED ORDER — BUPIVACAINE HCL (PF) 0.25 % IJ SOLN
INTRAMUSCULAR | Status: AC
  Filled 2019-04-11: qty 30

## 2019-04-11 MED ORDER — MEPERIDINE HCL 50 MG/ML IJ SOLN: 6.2500 mg | INTRAMUSCULAR | Status: DC | PRN

## 2019-04-11 MED ORDER — CEFAZOLIN SODIUM-DEXTROSE 2-4 GM/100ML-% IV SOLN
2.0000 g | INTRAVENOUS | Status: AC
  Administered 2019-04-11: 2 g via INTRAVENOUS
  Filled 2019-04-11: qty 100

## 2019-04-11 MED ORDER — SCOPOLAMINE 1 MG/3DAYS TD PT72
MEDICATED_PATCH | TRANSDERMAL | Status: DC | PRN
  Administered 2019-04-11: 1 via TRANSDERMAL

## 2019-04-11 MED ORDER — ONDANSETRON HCL 4 MG/2ML IJ SOLN
INTRAMUSCULAR | Status: AC
  Filled 2019-04-11: qty 2

## 2019-04-11 MED ORDER — ACETAMINOPHEN 650 MG RE SUPP: 650.0000 mg | Freq: Four times a day (QID) | RECTAL | Status: DC | PRN

## 2019-04-11 MED ORDER — PROPOFOL 10 MG/ML IV BOLUS
INTRAVENOUS | Status: AC
  Filled 2019-04-11: qty 20

## 2019-04-11 MED ORDER — HYDROCODONE-ACETAMINOPHEN 5-325 MG PO TABS
1.0000 | ORAL_TABLET | ORAL | Status: DC | PRN
  Administered 2019-04-11: 1 via ORAL
  Administered 2019-04-12: 2 via ORAL
  Administered 2019-04-12: 1 via ORAL
  Filled 2019-04-11: qty 2
  Filled 2019-04-11 (×2): qty 1

## 2019-04-11 MED ORDER — CALCIUM CARBONATE 1250 (500 CA) MG PO TABS
2.0000 | ORAL_TABLET | Freq: Three times a day (TID) | ORAL | Status: DC
  Administered 2019-04-11 – 2019-04-12 (×3): 1000 mg via ORAL
  Filled 2019-04-11 (×3): qty 1

## 2019-04-11 SURGICAL SUPPLY — 28 items
ATTRACTOMAT 16X20 MAGNETIC DRP (DRAPES) ×2 IMPLANT
BLADE SURG 15 STRL LF DISP TIS (BLADE) ×1 IMPLANT
BLADE SURG 15 STRL SS (BLADE) ×1
CHLORAPREP W/TINT 26 (MISCELLANEOUS) ×2 IMPLANT
CLIP VESOCCLUDE MED 6/CT (CLIP) ×4 IMPLANT
CLIP VESOCCLUDE SM WIDE 6/CT (CLIP) ×10 IMPLANT
COVER SURGICAL LIGHT HANDLE (MISCELLANEOUS) ×2 IMPLANT
COVER WAND RF STERILE (DRAPES) ×2 IMPLANT
DERMABOND ADVANCED (GAUZE/BANDAGES/DRESSINGS) ×1
DERMABOND ADVANCED .7 DNX12 (GAUZE/BANDAGES/DRESSINGS) ×1 IMPLANT
DRAPE LAPAROTOMY T 98X78 PEDS (DRAPES) ×2 IMPLANT
ELECT PENCIL ROCKER SW 15FT (MISCELLANEOUS) ×2 IMPLANT
ELECT REM PT RETURN 15FT ADLT (MISCELLANEOUS) ×2 IMPLANT
GAUZE 4X4 16PLY RFD (DISPOSABLE) ×2 IMPLANT
GLOVE SURG ORTHO 8.0 STRL STRW (GLOVE) ×2 IMPLANT
GOWN STRL REUS W/TWL XL LVL3 (GOWN DISPOSABLE) ×4 IMPLANT
HEMOSTAT SURGICEL 2X4 FIBR (HEMOSTASIS) ×2 IMPLANT
ILLUMINATOR WAVEGUIDE N/F (MISCELLANEOUS) ×2 IMPLANT
KIT BASIN OR (CUSTOM PROCEDURE TRAY) ×2 IMPLANT
KIT TURNOVER KIT A (KITS) ×2 IMPLANT
PACK BASIC VI WITH GOWN DISP (CUSTOM PROCEDURE TRAY) ×2 IMPLANT
SHEARS HARMONIC 9CM CVD (BLADE) ×2 IMPLANT
SUT MNCRL AB 4-0 PS2 18 (SUTURE) ×2 IMPLANT
SUT VIC AB 3-0 SH 18 (SUTURE) ×4 IMPLANT
SYR BULB IRRIGATION 50ML (SYRINGE) ×2 IMPLANT
TOWEL OR 17X26 10 PK STRL BLUE (TOWEL DISPOSABLE) ×2 IMPLANT
TOWEL OR NON WOVEN STRL DISP B (DISPOSABLE) ×2 IMPLANT
TUBING CONNECTING 10 (TUBING) ×2 IMPLANT

## 2019-04-11 NOTE — Interval H&P Note (Signed)
History and Physical Interval Note:  04/11/2019 6:59 AM  Joanna Morgan  has presented today for surgery, with the diagnosis of PAPILLARY THYROID CARCINOMA.  The various methods of treatment have been discussed with the patient and family. After consideration of risks, benefits and other options for treatment, the patient has consented to    Procedure(s): TOTAL THYROIDECTOMY WITH LIMITED LYMPH NODE DISSECTION (N/A) as a surgical intervention.    The patient's history has been reviewed, patient examined, no change in status, stable for surgery.  I have reviewed the patient's chart and labs.  Questions were answered to the patient's satisfaction.    Armandina Gemma, [place] [facility] Office: [place]

## 2019-04-11 NOTE — Anesthesia Postprocedure Evaluation (Signed)
Anesthesia Post Note  Patient: Joanna Morgan  Procedure(s) Performed: TOTAL THYROIDECTOMY WITH LIMITED LYMPH NODE DISSECTION (N/A Neck)     Patient location during evaluation: PACU Anesthesia Type: General Level of consciousness: sedated Pain management: pain level controlled Vital Signs Assessment: post-procedure vital signs reviewed and stable Respiratory status: spontaneous breathing Cardiovascular status: stable Postop Assessment: no apparent nausea or vomiting Anesthetic complications: no    Last Vitals:  Vitals:   04/11/19 1015 04/11/19 1030  BP: 128/87 128/73  Pulse: 67 69  Resp: 12 12  Temp:    SpO2: 100% 100%    Last Pain:  Vitals:   04/11/19 1030  TempSrc:   PainSc: Asleep   Pain Goal:                   Huston Foley

## 2019-04-11 NOTE — Anesthesia Procedure Notes (Signed)
Procedure Name: Intubation Date/Time: 04/11/2019 7:26 AM Performed by: Cynda Familia, CRNA Pre-anesthesia Checklist: Patient identified, Emergency Drugs available, Suction available and Patient being monitored Patient Re-evaluated:Patient Re-evaluated prior to induction Oxygen Delivery Method: Circle System Utilized Preoxygenation: Pre-oxygenation with 100% oxygen Induction Type: IV induction Ventilation: Mask ventilation without difficulty Laryngoscope Size: Miller and 2 Grade View: Grade I Tube type: Oral Tube size: 7.0 mm Number of attempts: 1 Airway Equipment and Method: Stylet Placement Confirmation: ETT inserted through vocal cords under direct vision,  positive ETCO2 and breath sounds checked- equal and bilateral Secured at: 21 cm Tube secured with: Tape Dental Injury: Teeth and Oropharynx as per pre-operative assessment  Comments: Smooth IV induction Hatchette-- intubation AM CRNA atraumatic-- teeth and mouth as preop -- bilat BS

## 2019-04-11 NOTE — Anesthesia Procedure Notes (Signed)
Performed by: Noah Lembke M, CRNA Oxygen Delivery Method: Simple face mask Placement Confirmation: positive ETCO2 and breath sounds checked- equal and bilateral Dental Injury: Teeth and Oropharynx as per pre-operative assessment        

## 2019-04-11 NOTE — Transfer of Care (Signed)
Immediate Anesthesia Transfer of Care Note  Patient: Joanna Morgan  Procedure(s) Performed: TOTAL THYROIDECTOMY WITH LIMITED LYMPH NODE DISSECTION (N/A Neck)  Patient Location: PACU  Anesthesia Type:General  Level of Consciousness: sedated  Airway & Oxygen Therapy: Patient Spontanous Breathing and Patient connected to face mask oxygen  Post-op Assessment: Report given to RN and Post -op Vital signs reviewed and stable  Post vital signs: Reviewed and stable  Last Vitals:  Vitals Value Taken Time  BP 136/96 04/11/19 0931  Temp    Pulse 63 04/11/19 0935  Resp 10 04/11/19 0935  SpO2 100 % 04/11/19 0935  Vitals shown include unvalidated device data.  Last Pain:  Vitals:   04/11/19 0622  TempSrc:   PainSc: 0-No pain         Complications: No apparent anesthesia complications

## 2019-04-11 NOTE — Op Note (Signed)
Procedure Note  Pre-operative Diagnosis:  Papillary thyroid carcinoma  Post-operative Diagnosis:  same  Surgeon:  Armandina Gemma, MD  Assistant:  Nedra Hai, MD   Procedure:  Total thyroidectomy with limited central compartment lymph node dissection  Anesthesia:  General  Estimated Blood Loss:  minimal  Drains: none         Specimen: thyroid to pathology  Indications:  Patient is referred by her primary care physician, Dr. Ria Bush, and Webb Silversmith, NP, for surgical evaluation and management of newly diagnosed papillary thyroid carcinoma. Patient had noted a nodule in the left neck on self-examination approximately 1 month ago. She was seen by her primary care providers and an ultrasound was performed on January 17, 2019. This showed a dominant nodule in the mid right thyroid lobe measuring 2.3 cm in size and a dominant nodule in the upper left thyroid lobe measuring 2.6 cm in size. Biopsy of both nodules was recommended. Patient underwent fine-needle aspiration biopsy on February 13, 2019. The nodule on the right was benign. The nodule on the left was positive for papillary thyroid carcinoma. Patient was referred for surgical evaluation and management.   Procedure Details: Procedure was done in OR #1 at the [facility]. The patient was brought to the operating room and placed in a supine position on the operating room table. Following administration of general anesthesia, the patient was positioned and then prepped and draped in the usual aseptic fashion. After ascertaining that an adequate level of anesthesia had been achieved, a small Kocher incision was made with #15 blade. Dissection was carried through subcutaneous tissues and platysma.Hemostasis was achieved with the electrocautery. Skin flaps were elevated cephalad and caudad from the thyroid notch to the sternal notch. A Mahorner self-retaining retractor was placed for exposure. Strap muscles were incised in the  midline and dissection was begun on the left side.  Strap muscles were reflected laterally.  Left thyroid lobe was mildly enlarged with a dominant nodule in the superior pole.  The left lobe was gently mobilized with blunt dissection. Superior pole vessels were dissected out and divided individually between small and medium ligaclips with the harmonic scalpel. The thyroid lobe was rolled anteriorly. Branches of the inferior thyroid artery were divided between small ligaclips with the harmonic scalpel. Inferior venous tributaries were divided between ligaclips. Both the superior and inferior parathyroid glands were identified and preserved on their vascular pedicles. The recurrent laryngeal nerve was identified and preserved along its course. The ligament of Gwenlyn Found was released with the electrocautery and the gland was mobilized onto the anterior trachea. Isthmus was mobilized across the midline. There was a small pyramidal lobe present which was resected with the istthmus. Dry pack was placed in the left neck.  The right thyroid lobe was gently mobilized with blunt dissection. Right thyroid lobe was mildly enlarged with a nodule at the superior pole. Superior pole vessels were dissected out and divided between small and medium ligaclips with the Harmonic scalpel. Superior parathyroid was identified and preserved. Inferior venous tributaries were divided between medium ligaclips with the harmonic scalpel. The right thyroid lobe was rolled anteriorly and the branches of the inferior thyroid artery divided between small ligaclips. The right recurrent laryngeal nerve was identified and preserved along its course. The ligament of Gwenlyn Found was released with the electrocautery. The right thyroid lobe was mobilized onto the anterior trachea and the remainder of the thyroid was dissected off the anterior trachea and the thyroid was completely excised. A suture was  used to mark the left lobe. The entire thyroid gland was  submitted to pathology for review.  Dissection was continued anterior to the trachea extending from near the right carotid to the left carotid and inferiorly towards the innominate.  Tissue was resected which contained numerous small lymph nodes which appeared clinically benign.  The electrocautery was used for hemostasis.  The specimen was submitted separately to pathology for review labeled as central compartment lymph nodes, Zone VI.  The neck was irrigated with warm saline. Fibrillar was placed throughout the operative field. Strap muscles were approximated in the midline with interrupted 3-0 Vicryl sutures. Platysma was closed with interrupted 3-0 Vicryl sutures. Skin was closed with a running 4-0 Monocryl subcuticular suture. Wound was washed and Dermabond was applied. The patient was awakened from anesthesia and brought to the recovery room. The patient tolerated the procedure well.   Armandina Gemma, MD [facility] Office: [phone]

## 2019-04-12 ENCOUNTER — Encounter (HOSPITAL_COMMUNITY): Payer: Self-pay | Admitting: Surgery

## 2019-04-12 DIAGNOSIS — C73 Malignant neoplasm of thyroid gland: Secondary | ICD-10-CM | POA: Diagnosis not present

## 2019-04-12 LAB — BASIC METABOLIC PANEL
Anion gap: 9 (ref 5–15)
BUN: 8 mg/dL (ref 6–20)
CO2: 25 mmol/L (ref 22–32)
Calcium: 7.9 mg/dL — ABNORMAL LOW (ref 8.9–10.3)
Chloride: 102 mmol/L (ref 98–111)
Creatinine, Ser: 0.66 mg/dL (ref 0.44–1.00)
GFR calc Af Amer: >60 mL/min (ref >60)
GFR calc non Af Amer: >60 mL/min (ref >60)
Glucose, Bld: 98 mg/dL (ref 70–99)
Potassium: 3.7 mmol/L (ref 3.5–5.1)
Sodium: 136 mmol/L (ref 135–145)

## 2019-04-12 MED ORDER — CALCIUM CARBONATE ANTACID 500 MG PO CHEW: 2.0000 | CHEWABLE_TABLET | Freq: Three times a day (TID) | ORAL | 1 refills | Status: AC

## 2019-04-12 MED ORDER — CALCIUM GLUCONATE-NACL 2-0.675 GM/100ML-% IV SOLN
2.0000 g | INTRAVENOUS | Status: AC
  Administered 2019-04-12: 2000 mg via INTRAVENOUS
  Filled 2019-04-12: qty 100

## 2019-04-12 MED ORDER — HYDROCODONE-ACETAMINOPHEN 5-325 MG PO TABS: 1.0000 | ORAL_TABLET | ORAL | 0 refills | Status: AC | PRN

## 2019-04-12 MED ORDER — LEVOTHYROXINE SODIUM 88 MCG PO TABS: 88.0000 ug | ORAL_TABLET | Freq: Every day | ORAL | 3 refills | Status: AC

## 2019-04-12 NOTE — Discharge Instructions (Signed)
[facility]  THYROID & PARATHYROID SURGERY:  POST-OP INSTRUCTIONS  Always review your discharge instruction sheet from the facility where your surgery was performed.  A prescription for pain medication may be given to you upon discharge.  Take your pain medication as prescribed.  If narcotic pain medicine is not needed, then you may take acetaminophen (Tylenol) or ibuprofen (Advil) as needed.  Take your usually prescribed medications unless otherwise directed.  If you need a refill on your pain medication, please contact our office during regular business hours.  Prescriptions cannot be processed by our office after 5 pm or on weekends.  Start with a light diet upon arrival home, such as soup and crackers or toast.  Be sure to drink plenty of fluids daily.  Resume your normal diet the day after surgery.  Most patients will experience some swelling and bruising on the chest and neck area.  Ice packs will help.  Swelling and bruising can take several days to resolve.   It is common to experience some constipation after surgery.  Increasing fluid intake and taking a stool softener (Colace) will usually help or prevent this problem.  A mild laxative (Milk of Magnesia or Miralax) should be taken according to package directions if there has been no bowel movement after 48 hours.  You have steri-strips and a gauze dressing over your incision.  You may remove the gauze bandage on the second day after surgery, and you may shower at that time.  Leave your steri-strips (small skin tapes) in place directly over the incision.  These strips should remain on the skin for 5-7 days and then be removed.  You may get them wet in the shower and pat them dry.  You may resume regular (light) daily activities beginning the next day (such as daily self-care, walking, climbing stairs) gradually increasing activities as tolerated.  You may have sexual intercourse when it is comfortable.  Refrain from  any heavy lifting or straining until approved by your doctor.  You may drive when you no longer are taking prescription pain medication, you can comfortably wear a seatbelt, and you can safely maneuver your car and apply brakes.  You should see your doctor in the office for a follow-up appointment approximately three weeks after your surgery.  Make sure that you call for this appointment within a day or two after you arrive home to insure a convenient appointment time.  WHEN TO CALL YOUR DOCTOR: -- Fever greater than 101.5 -- Inability to urinate -- Nausea and/or vomiting - persistent -- Extreme swelling or bruising -- Continued bleeding from incision -- Increased pain, redness, or drainage from the incision -- Difficulty swallowing or breathing -- Muscle cramping or spasms -- Numbness or tingling in hands or around lips  The clinic staff is available to answer your questions during regular business hours.  Please don't hesitate to call and ask to speak to one of the nurses if you have concerns.  Yalonda Sample, MD [facility] Office: [phone] 

## 2019-04-12 NOTE — Progress Notes (Signed)
Discharge instructions given to pt and all questions were answered.  

## 2019-04-12 NOTE — Discharge Summary (Signed)
    Physician Discharge Summary [facility]  Patient ID: Joanna Morgan MRN: [id number] DOB/AGE: 1986/05/14 33 y.o.  Admit date: 04/11/2019 Discharge date: 04/12/2019  Admission Diagnoses:  Papillary thyroid carcinoma  Discharge Diagnoses:  Principal Problem:   Papillary thyroid carcinoma [facility])   Discharged Condition: good  Hospital Course: Patient was admitted for observation following thyroid surgery.  Post op course was uncomplicated.  Pain was well controlled.  Tolerated diet.  Post op calcium level on morning following surgery was 7.9 mg/dl.  IV calcium gluconate 2 gm given prior to discharge home.  Patient was prepared for discharge home on POD#1.  Consults: None  Treatments: surgery: total thyroidectomy with limited lymph node dissection  Discharge Exam: Blood pressure (!) 101/59, pulse 62, temperature 98.3 F (36.8 C), temperature source Oral, resp. rate 18, height 5\' 7"  (1.702 m), weight 56.2 kg, SpO2 100 %, currently breastfeeding. HEENT - clear Neck - wound dry and intact; minimal STS; voice normal Chest - clear bilaterally Cor - RRR   Disposition: Home   Allergies as of 04/12/2019      Reactions   Sulfa Antibiotics Shortness Of Breath   Codeine Phosphate Rash   All codeine derivatives      Medication List    TAKE these medications   calcium carbonate 500 MG chewable tablet Commonly known as: Tums Chew 2 tablets (400 mg of elemental calcium total) by mouth 3 (three) times daily.   cetirizine 10 MG tablet Commonly known as: ZYRTEC Take 10 mg by mouth daily.   HYDROcodone-acetaminophen 5-325 MG tablet Commonly known as: NORCO/VICODIN Take 1-2 tablets by mouth every 4 (four) hours as needed for moderate pain.   ibuprofen 200 MG tablet Commonly known as: ADVIL Take 200 mg by mouth every 6 (six) hours as needed for moderate pain.   levothyroxine 88 MCG tablet Commonly known as: Synthroid Take 1 tablet (88 mcg total) by mouth daily  before breakfast.   Liletta (52 MG) 19.5 MCG/DAY Iud IUD Generic drug: levonorgestrel 1 each by Intrauterine route once.   norethindrone 0.35 MG tablet Commonly known as: MICRONOR TAKE 1 TABLET BY MOUTH EVERY DAY      Follow-up Information    Armandina Gemma, MD. Schedule an appointment as soon as possible for a visit in 3 week(s).   Specialty: General Surgery Contact information: [address] [place] [address] [phone]           Earnstine Regal, MD, [facility] Office: [phone]   Signed: Armandina Gemma 04/12/2019, 9:39 AM

## 2019-04-16 LAB — SURGICAL PATHOLOGY

## 2019-04-17 ENCOUNTER — Other Ambulatory Visit (INDEPENDENT_AMBULATORY_CARE_PROVIDER_SITE_OTHER): Payer: BC Managed Care – PPO

## 2019-04-17 ENCOUNTER — Ambulatory Visit (INDEPENDENT_AMBULATORY_CARE_PROVIDER_SITE_OTHER): Payer: BC Managed Care – PPO

## 2019-04-17 ENCOUNTER — Other Ambulatory Visit: Payer: Self-pay

## 2019-04-17 DIAGNOSIS — E538 Deficiency of other specified B group vitamins: Secondary | ICD-10-CM

## 2019-04-17 MED ORDER — CYANOCOBALAMIN 1000 MCG/ML IJ SOLN
1000.0000 ug | Freq: Once | INTRAMUSCULAR | Status: AC
  Administered 2019-04-17: 1000 ug via INTRAMUSCULAR

## 2019-04-17 NOTE — Progress Notes (Signed)
After discussing with Webb Silversmith about her B12 shot and labs, we decided to do her labs then do her injection.  Injection given in Right Deltoid.

## 2019-04-18 ENCOUNTER — Telehealth: Payer: Self-pay

## 2019-04-18 LAB — VITAMIN B12: Vitamin B-12: 466 pg/mL (ref 211–911)

## 2019-04-18 NOTE — Telephone Encounter (Signed)
She can do fleets enema x 1 more time. If that doesn't help she can try Dulcolax

## 2019-04-18 NOTE — Telephone Encounter (Signed)
Pt called back wanting someone to call her  Best number [phone]

## 2019-04-18 NOTE — Telephone Encounter (Signed)
Pt left v/m; [facility] nurse pt spoke with pt earlier this morning advised pt to use fleet enema and pt did that and it did help but on enema box it says to use only once in 24 hrs. If constipation continues pt request cb with what to do. Also pt wants to know if can drink mineral oil if has already taken Miralax this morning. Pt request cb.

## 2019-04-18 NOTE — Telephone Encounter (Signed)
[facility] Night - Client TELEPHONE ADVICE RECORD [facility] Patient Name: Joanna Morgan Gender: Female DOB: 1985-10-23 Age: 33 Y 76 M 12 D Return Phone Number: [phone] (Primary) Address: City/State/Zip: [place] [address] Client [facility] Night - Client Client Site [place] Physician Joanna Morgan - NP Contact Type Call Who Is Calling Patient / Member / Family / Caregiver Call Type Triage / Clinical Relationship To Patient Self Return Phone Number [phone] (Primary) Chief Complaint Abdominal Pain Reason for Call Symptomatic / Request for Joanna Morgan to remove her thyroid on last Thursday. She has not been able to have a good BM. Translation No Nurse Assessment Nurse: Joanna Pang, RN, Joanna Morgan Date/Time Joanna Morgan Time): 04/18/2019 6:38:59 AM Confirm and document reason for call. If symptomatic, describe symptoms. ---Caller states that she had thyroid Morgan on Thursday and has not had a good bm since. Very painful when she tries to go. Has the patient had close contact with a person known or suspected to have the novel coronavirus illness OR traveled / lives in area with major community spread (including international travel) in the last 14 days from the onset of symptoms? * If Asymptomatic, screen for exposure and travel within the last 14 days. ---No Does the patient have any new or worsening symptoms? ---Yes Will a triage be completed? ---Yes Related visit to physician within the last 2 weeks? ---Yes Does the PT have any chronic conditions? (i.e. diabetes, asthma, this includes High risk factors for pregnancy, etc.) ---No Is the patient pregnant or possibly pregnant? (Ask all females between the ages of 45-55) ---No Is this a behavioral health or substance abuse call? ---No Guidelines Guideline Title Affirmed Question Affirmed Notes  Nurse Date/Time (Eastern Time) Constipation Abdomen is more swollen than usual Joanna Morgan 04/18/2019 6:40:30 AM Disp. Time Joanna Morgan Time) Disposition Final User 04/18/2019 6:47:14 AM See PCP within 24 Hours Yes Joanna Pang, RN, Joanna Morgan PLEASE NOTE: All timestamps contained within this report are represented as [place] Standard Time. CONFIDENTIALTY NOTICE: This fax transmission is intended only for the addressee. It contains information that is legally privileged, confidential or otherwise protected from use or disclosure. If you are not the intended recipient, you are strictly prohibited from reviewing, disclosing, copying using or disseminating any of this information or taking any action in reliance on or regarding this information. If you have received this fax in error, please notify us immediately by telephone so that we can arrange for its return to [place]. Phone: [phone], Toll-Free: [phone], Fax: [phone] Page: 2 of 2 Call Id: [id number] [place] Disagree/Comply Comply Caller Understands Yes PreDisposition Did not know what to do Care Advice Given Per Guideline SEE PCP WITHIN 24 HOURS: * Drink adequate liquids. OSMOTIC LAXATIVES: FLEET ENEMA: * A Fleet phosphate enema may be helpful if you suspect that stool is trapped in the rectum (fecal impaction). CALL BACK IF: * Severe or increasing abdominal pain * Vomiting occurs * You become worse. CARE ADVICE given per Constipation (Adult) guideline. Referrals REFERRED TO PCP OFFICE

## 2019-04-19 NOTE — Telephone Encounter (Signed)
Spoke to pt and she let me know that she called GI and is some better told to take Miralax every 2 hours with suppository

## 2019-04-22 ENCOUNTER — Ambulatory Visit (INDEPENDENT_AMBULATORY_CARE_PROVIDER_SITE_OTHER): Payer: BC Managed Care – PPO | Admitting: Family Medicine

## 2019-04-22 ENCOUNTER — Other Ambulatory Visit: Payer: Self-pay

## 2019-04-22 ENCOUNTER — Encounter: Payer: Self-pay | Admitting: Family Medicine

## 2019-04-22 VITALS — BP 101/67 | HR 86 | Wt 120.0 lb

## 2019-04-22 DIAGNOSIS — Z30431 Encounter for routine checking of intrauterine contraceptive device: Secondary | ICD-10-CM | POA: Diagnosis not present

## 2019-04-22 NOTE — Progress Notes (Signed)
   Subjective:    Patient ID: Joanna Morgan is a 33 y.o. female presenting with Follow-up  on 04/22/2019  HPI: Here for IUD string check. No issues. Some bleeding after thyroidectomy.  Review of Systems  Constitutional: Negative for chills and fever.  Respiratory: Negative for shortness of breath.   Cardiovascular: Negative for chest pain.  Gastrointestinal: Negative for abdominal pain, nausea and vomiting.  Genitourinary: Positive for vaginal bleeding. Negative for dysuria.  Skin: Negative for rash.      Objective:    BP 101/67   Pulse 86   Wt 120 lb (54.4 kg)   BMI 18.79 kg/m  Physical Exam Constitutional:      General: She is not in acute distress.    Appearance: She is well-developed.  HENT:     Head: Normocephalic and atraumatic.  Eyes:     General: No scleral icterus. Neck:     Musculoskeletal: Neck supple.  Cardiovascular:     Rate and Rhythm: Normal rate.  Pulmonary:     Effort: Pulmonary effort is normal.  Abdominal:     Palpations: Abdomen is soft.  Genitourinary:    Comments: BUS normal, vagina is pink and rugated, cervix is parous without lesion, strings are noted  Skin:    General: Skin is warm and dry.  Neurological:     Mental Status: She is alert and oriented to person, place, and time.         Assessment & Plan:   Problem List Items Addressed This Visit    None    Visit Diagnoses    IUD check up    -  Primary   in place. Return with issues. Moving to [employer] next week.      Total face-to-face time with patient: 10 minutes. Over 50% of encounter was spent on counseling and coordination of care. Return if symptoms worsen or fail to improve.  Donnamae Jude 04/22/2019 4:56 PM

## 2019-09-04 IMAGING — US US THYROID
1 series · 13 of 25 positions shown · non-contrast
Comparison: None.

CLINICAL DATA: Palpable abnormality. Palpable left-sided thyroid
nodule.

EXAM:
THYROID ULTRASOUND
TECHNIQUE: Ultrasound examination of the thyroid gland and adjacent soft
tissues was performed.

[Series 1: us thyroid · 0.05mm/px · 13 of 70 slices shown]
[im 1/70]
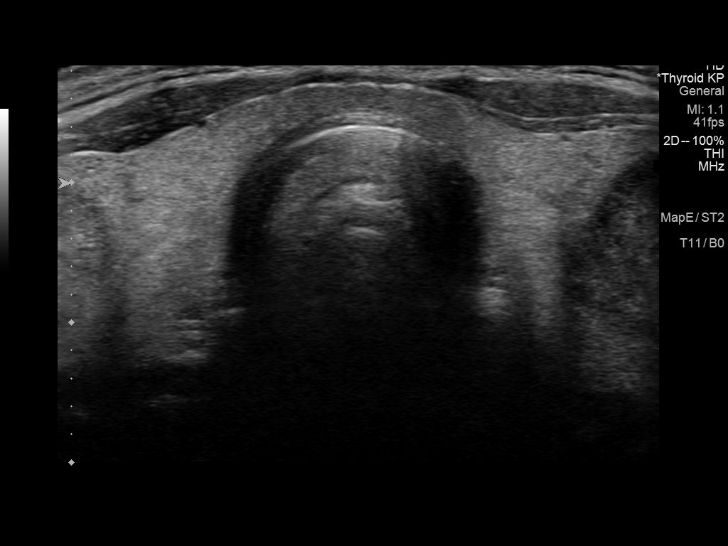
[im 6/70]
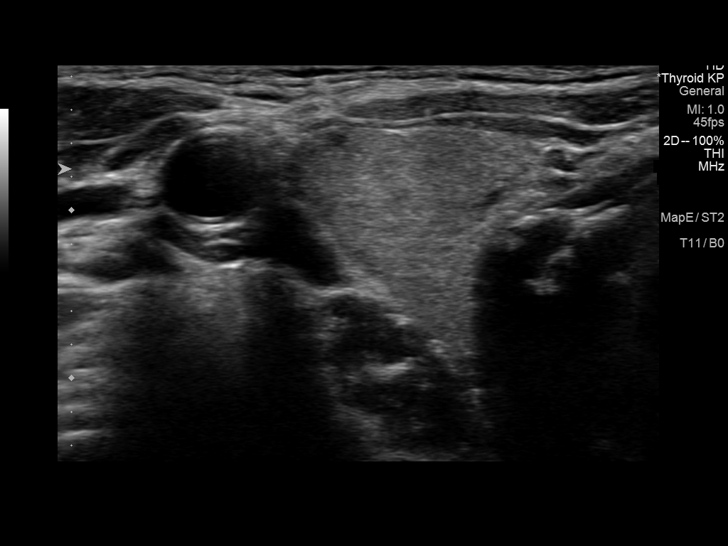
[im 12/70]
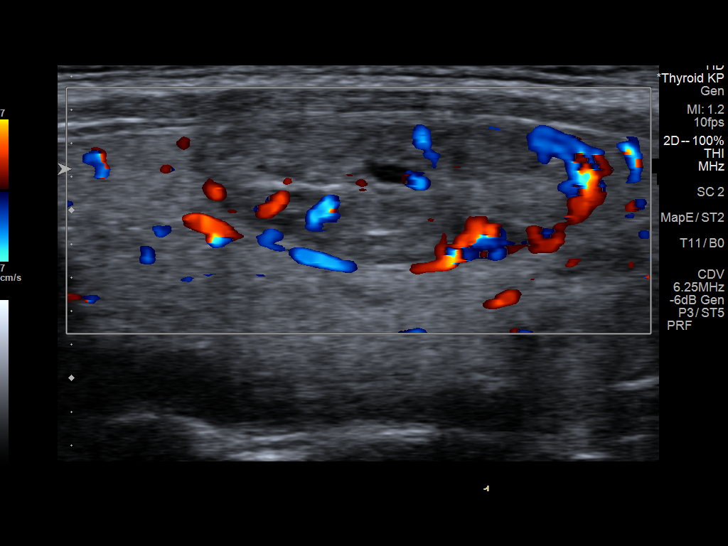
[im 18/70]
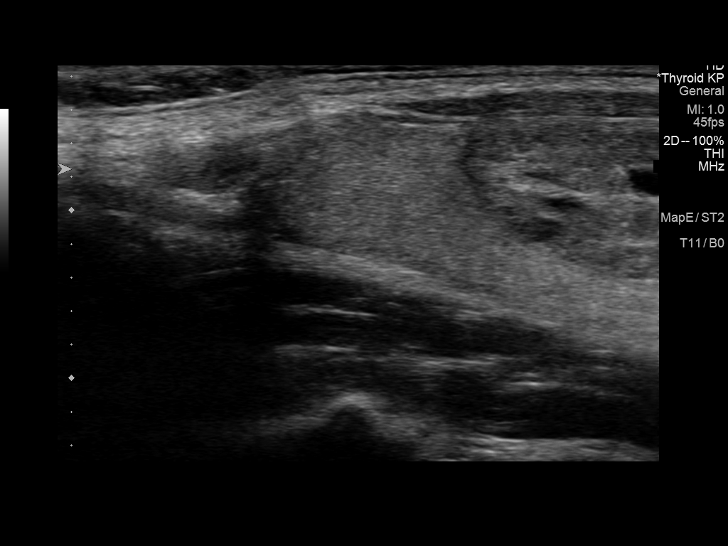
[im 24/70]
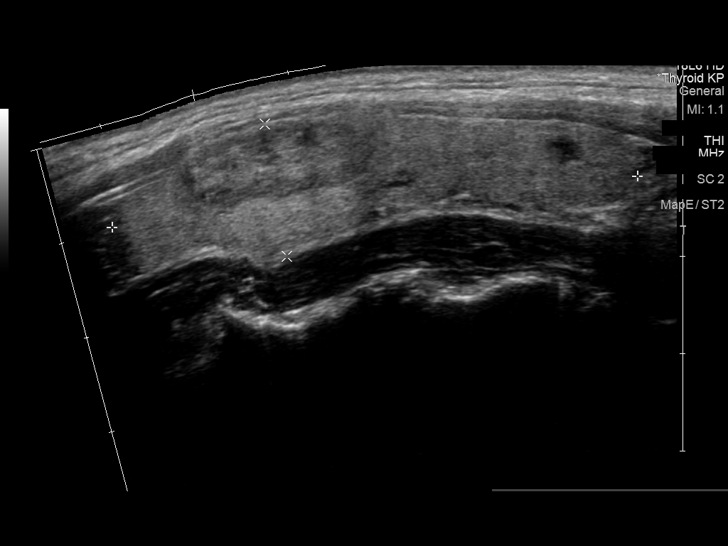
[im 29/70]
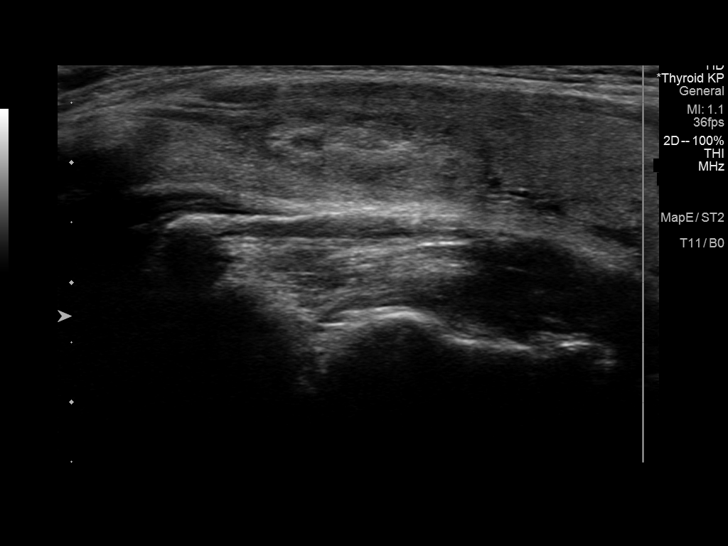
[im 35/70]
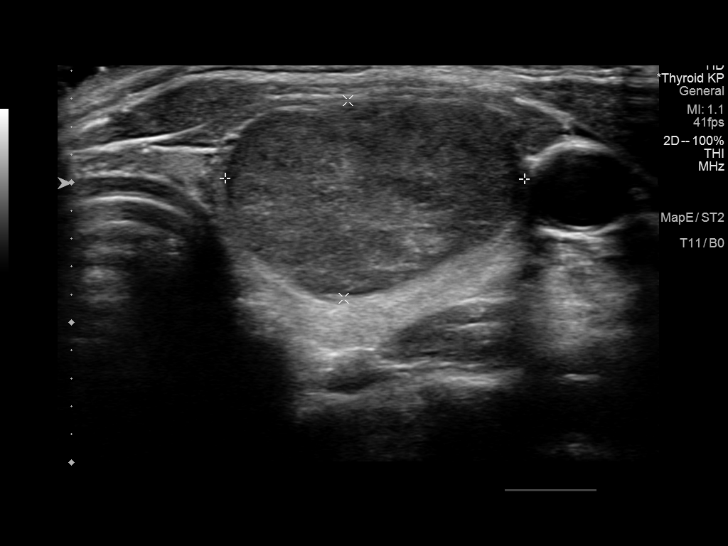
[im 41/70]
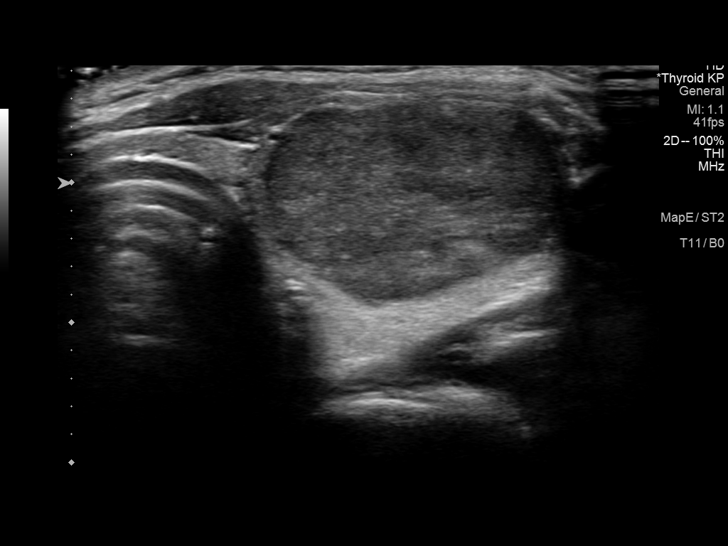
[im 47/70]
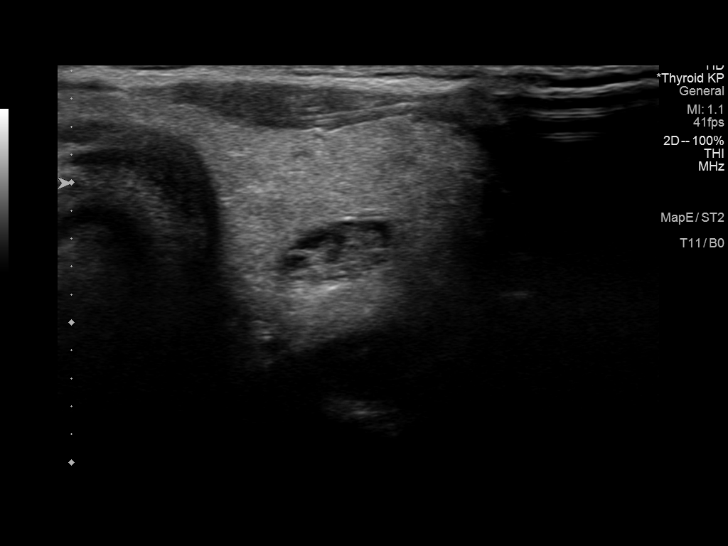
[im 52/70]
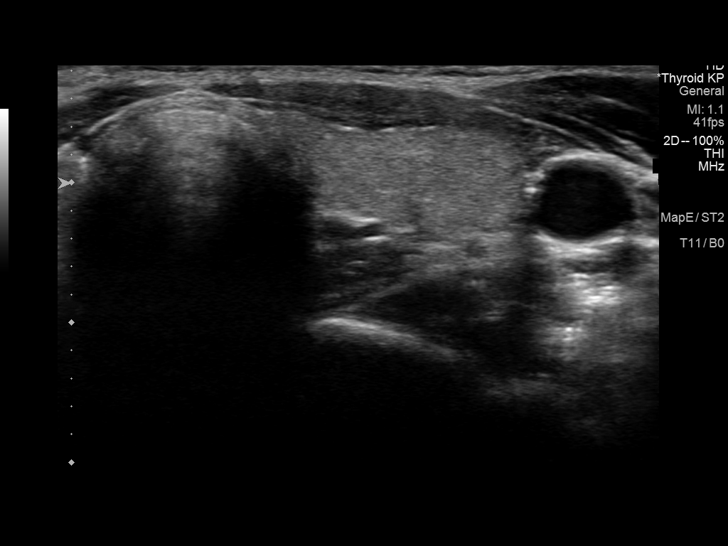
[im 58/70]
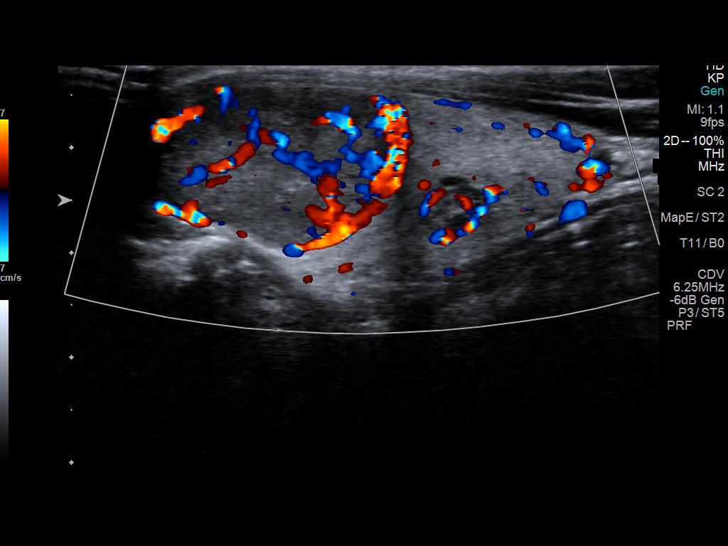
[im 64/70]
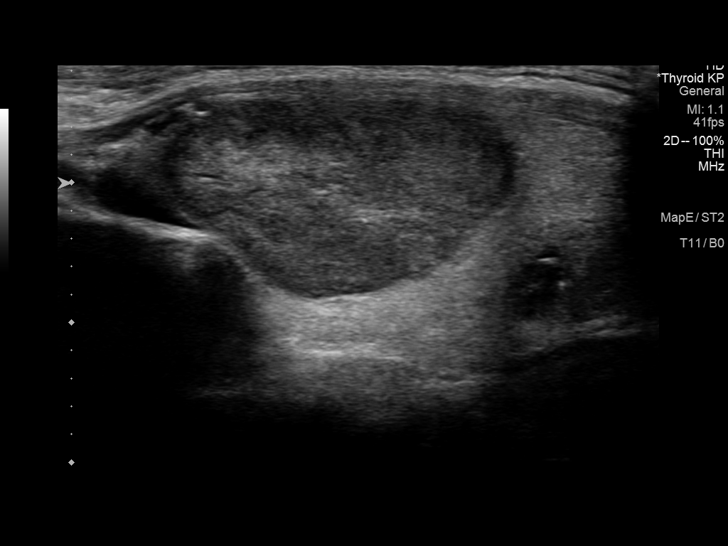
[im 70/70]
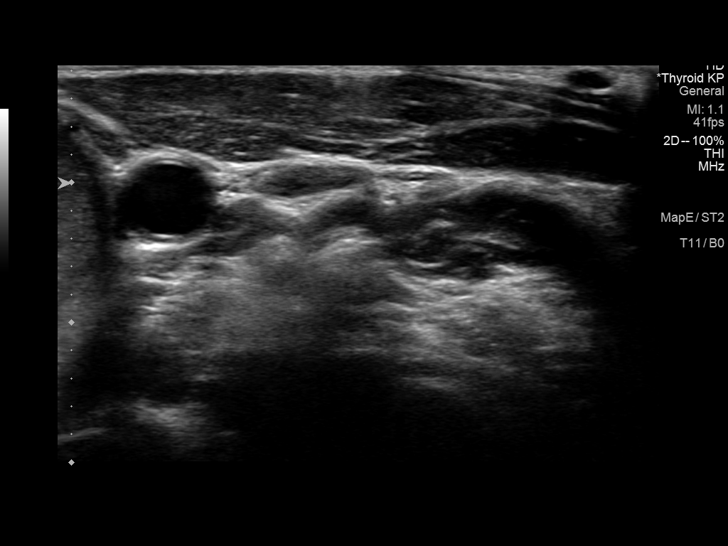

[13 of 25 positions shown; findings below may reference images not displayed]

FINDINGS: Parenchymal Echotexture: Mildly heterogenous

Isthmus: Normal in size measures 0.2 cm in diameter

Right lobe: Borderline enlarged measuring 5.9 x 1.6 x 2.2 cm

Left lobe: Borderline enlarged measuring 5.0 x 1.9 x 2.3 cm

_________________________________________________________

Estimated total number of nodules >/= 1 cm: 2

Number of spongiform nodules >/=  2 cm not described below (TR1): 0

Number of mixed cystic and solid nodules >/= 1.5 cm not described
below (TR2): 0

_________________________________________________________

Nodule # 1:

Location: Right; Mid

Maximum size: 2.3 cm; Other 2 dimensions: 1.8 x 0.8 cm

Composition: solid/almost completely solid (2)

Echogenicity: hypoechoic (2)

Shape: not taller-than-wide (0)

Margins: smooth (0)

Echogenic foci: none (0)

ACR TI-RADS total points: 4.

ACR TI-RADS risk category: TR4 (4-6 points).

ACR TI-RADS recommendations:

**Given size (>/= 1.5 cm) and appearance, fine needle aspiration of
this moderately suspicious nodule should be considered based on
TI-RADS criteria.

_________________________________________________________

There is a punctate (approximately 0.5 cm) anechoic cyst within the
inferior pole the right lobe of the thyroid which does not meet
imaging criteria to recommend percutaneous sampling or continued
dedicated follow-up.

_________________________________________________________

Nodule # 2:

Location: Left; Superior - this nodule likely correlates with the
patient's palpable area of concern.

Maximum size: 2.6 cm; Other 2 dimensions: 2.1 x 1.4 cm

Composition: solid/almost completely solid (2)

Echogenicity: hypoechoic (2)

Shape: not taller-than-wide (0)

Margins: smooth (0)

Echogenic foci: punctate echogenic foci (3)

ACR TI-RADS total points: 7.

ACR TI-RADS risk category: TR5 (>/= 7 points).

ACR TI-RADS recommendations:

**Given size (>/= 1.0 cm) and appearance, fine needle aspiration of
this highly suspicious nodule should be considered based on TI-RADS
criteria.

_________________________________________________________

There is a well-defined approximately 0.8 x 0.6 x 0.5 cm
spongiform/benign appearing nodule within mid aspect the left lobe
of the thyroid (labeled 3), which does not meet imaging criteria to
recommend percutaneous sampling or continued dedicated follow-up.
IMPRESSION: 1. Borderline thyromegaly with findings suggestive of multinodular
goiter.
2. Nodules #1 and #2 both meet imaging criteria to recommend
percutaneous sampling as clinically indicated. Note, nodule #2
likely correlates with the patient's palpable area of concern.

The above is in keeping with the ACR TI-RADS recommendations - [HOSPITAL] 3444;[DATE].

## 2019-10-01 IMAGING — US ULTRASOUND FNA BIOPSY THYROID 1ST LESION
1 series · 13 of 25 positions shown · non-contrast
Comparison: US Thyroid 01/17/19

MEDICATIONS:
10 cc 1% lidocaine

COMPLICATIONS:
None immediate.

INDICATION: Indeterminate thyroid nodule

Right mid lobe thyroid nodule 2.3 cm
Left superior thyroid nodule 2.6 cm
EXAM:
ULTRASOUND GUIDED FINE NEEDLE ASPIRATION OF INDETERMINATE THYROID
NODULE
TECHNIQUE: Informed written consent was obtained from the patient after a
discussion of the risks, benefits and alternatives to treatment.
Questions regarding the procedure were encouraged and answered. A
timeout was performed prior to the initiation of the procedure.

[Series 1: ultrasound fna biopsy thyroid 1st lesion · 0.04mm/px · 34 acquisitions, 13 frames shown]
[im 1/34]
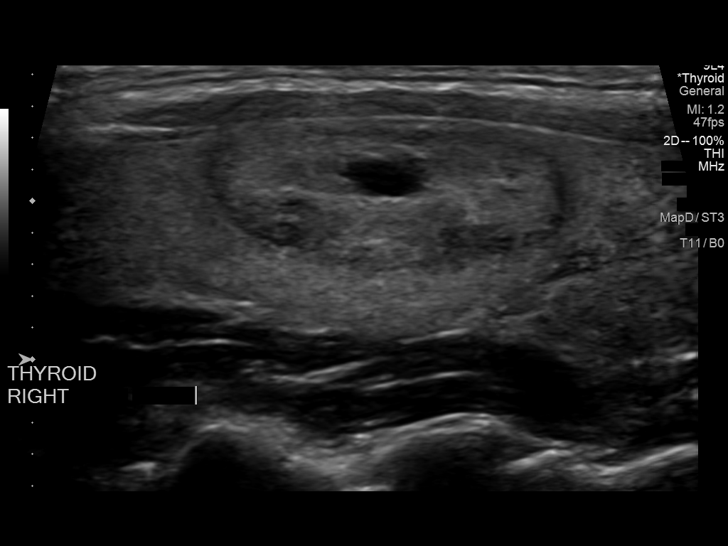
[im 3/34]
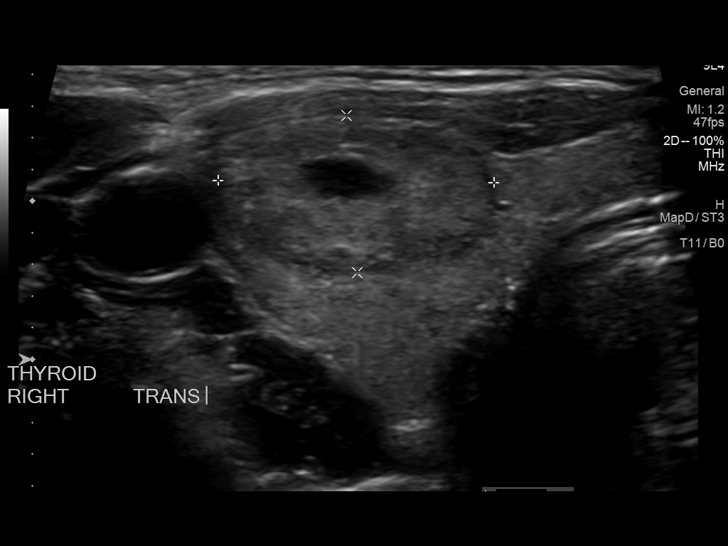
[im 6/34]
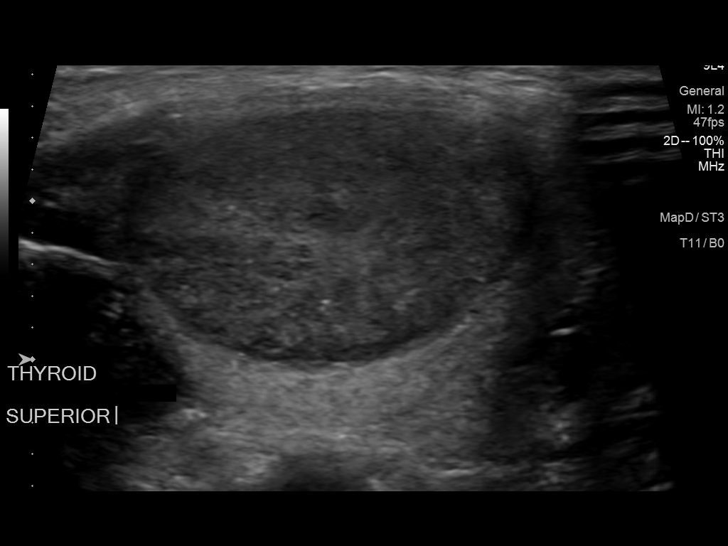
[im 9/34]
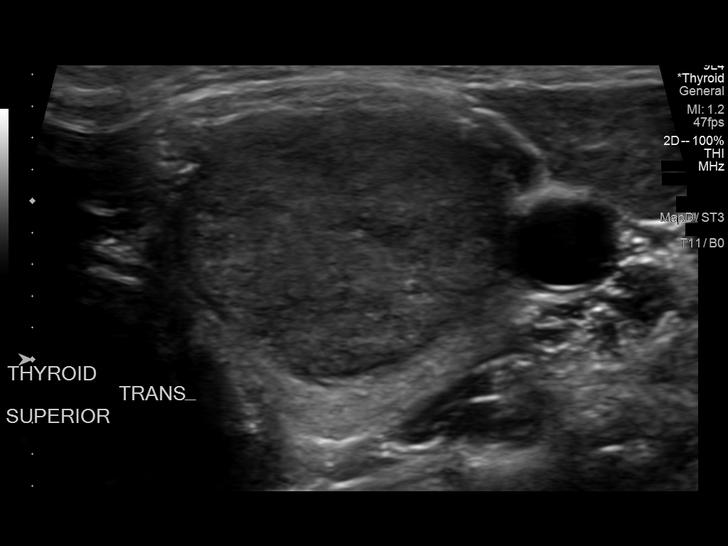
[im 12/34]
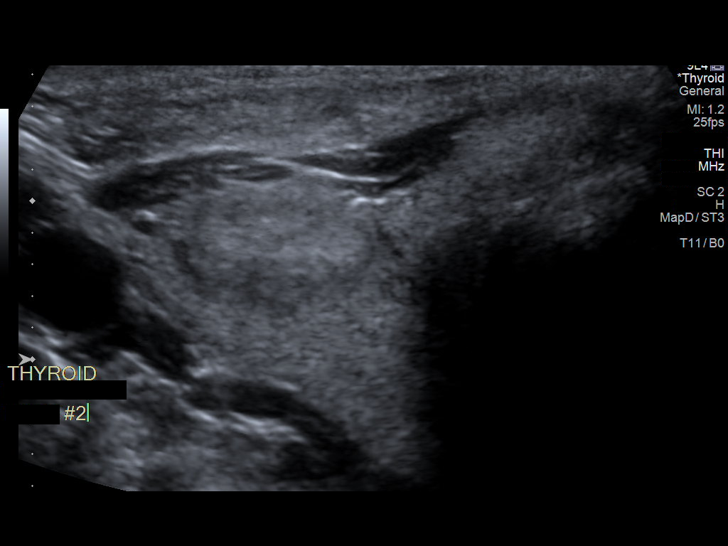
[im 14/34]
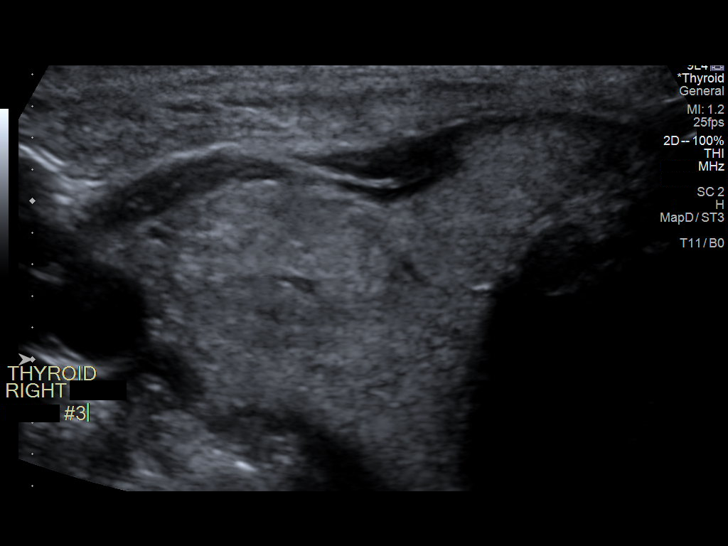
[im 17/34]
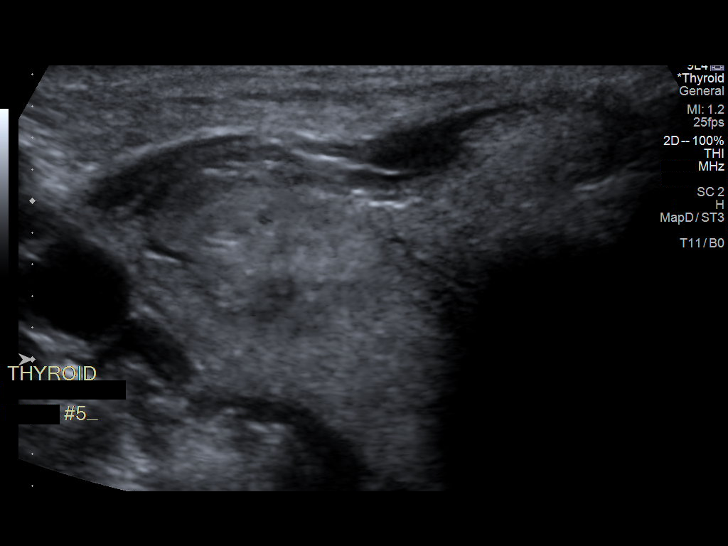
[im 20/34]
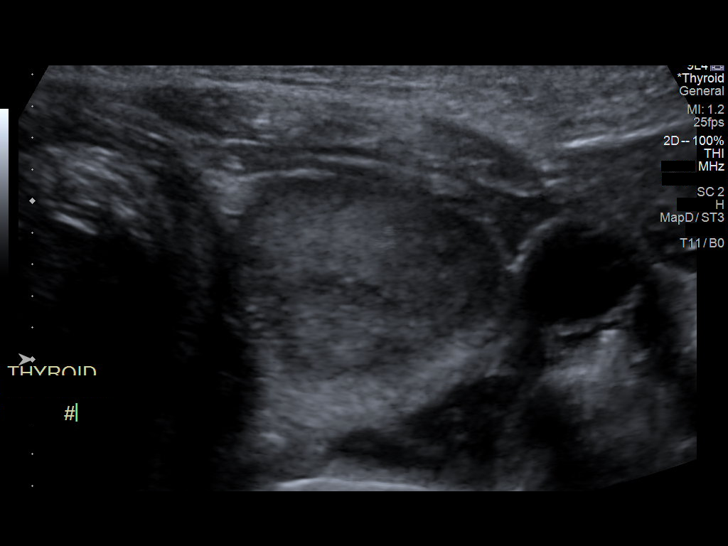
[im 23/34]
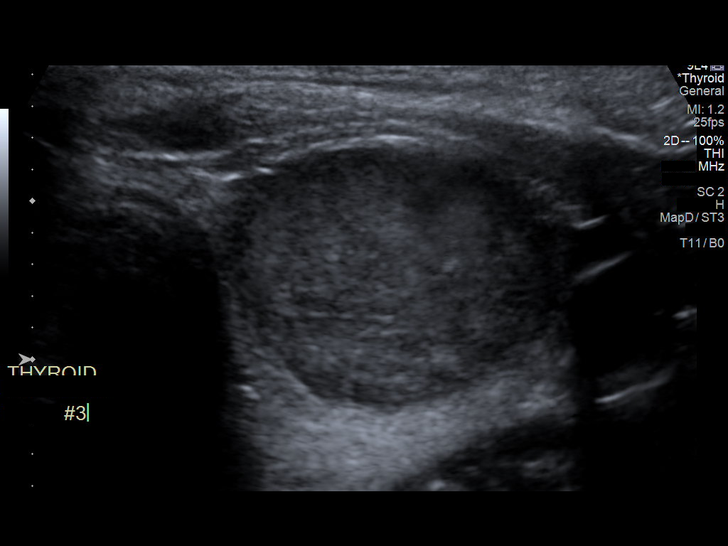
[im 25/34]
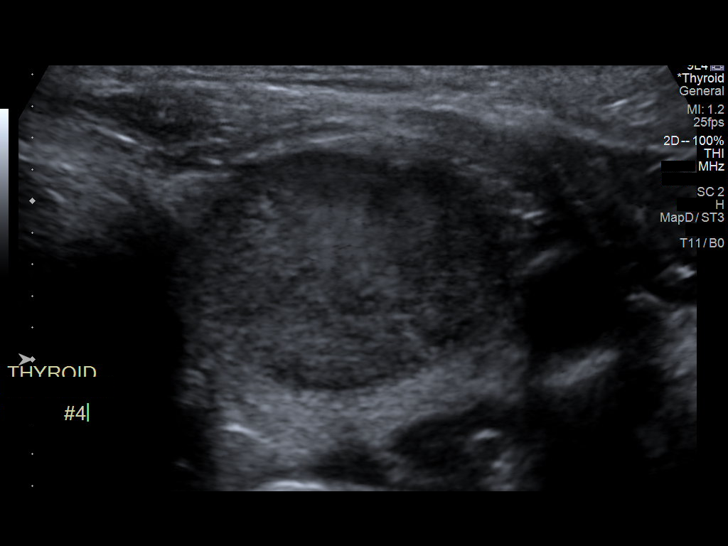
[im 28/34]
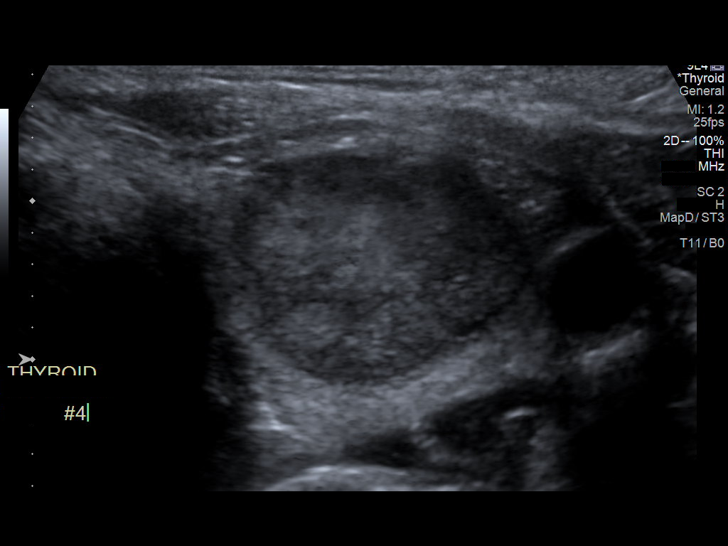
[im 31/34]
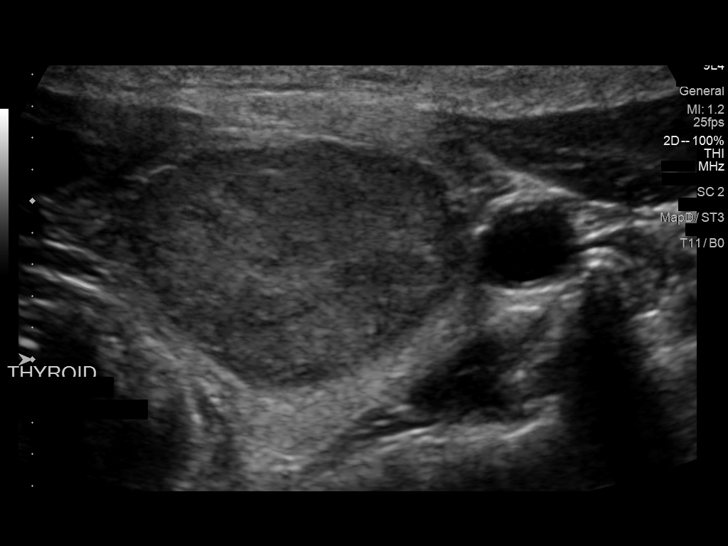
[im 34/34]
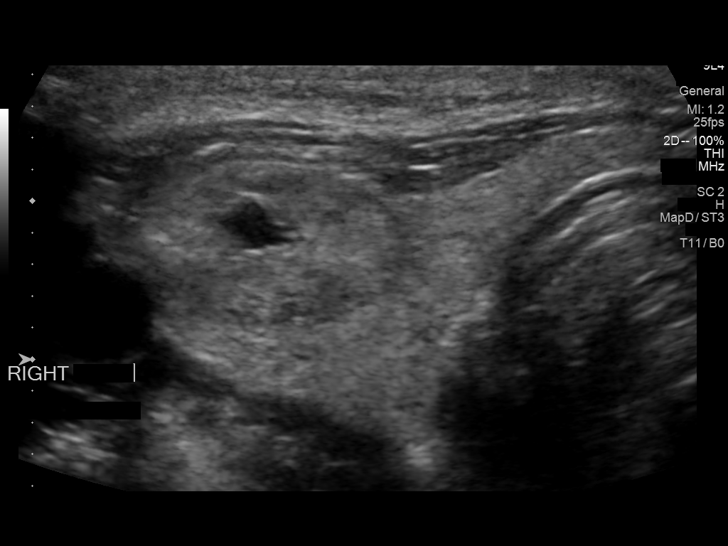

[13 of 25 positions shown; findings below may reference images not displayed]

Pre-procedural ultrasound scanning demonstrated unchanged size and
appearance of the indeterminate nodules within the right and left
thyroid

The procedure was planned. The neck was prepped in the usual sterile
fashion, and a sterile drape was applied covering the operative
field. A timeout was performed prior to the initiation of the
procedure. Local anesthesia was provided with 1% lidocaine.

Under direct ultrasound guidance, 5 FNA biopsies were performed of
the right mid lobe thyroid nodule with a 25 gauge needle.

2 of these samples were obtained for AFIRMA per ordering LINNTAM

Multiple ultrasound images were saved for procedural documentation
purposes. The samples were prepared and submitted to pathology.

Under direct ultrasound guidance, 5 FNA biopsies were performed of
the left superior thyroid nodule with a 25 gauge needle.

2 of these samples were obtained for AFIRMA per ordering LINNTAM

Multiple ultrasound images were saved for procedural documentation
purposes. The samples were prepared and submitted to pathology.

Limited post procedural scanning was negative for hematoma or
additional complication. Dressings were placed. The patient
tolerated the above procedures procedure well without immediate
postprocedural complication.
FINDINGS: Nodule reference number based on prior diagnostic ultrasound: 1

Maximum size: 2.3 cm

Location: Right; Mid

ACR TI-RADS risk category: TR4 (4-6 points)

Reason for biopsy: meets ACR TI-RADS criteria

_________________________________________________________

Nodule reference number based on prior diagnostic ultrasound: 2

Maximum size: 2.6 cm

Location: Left; Superior

ACR TI-RADS risk category: TR5 (>/= 7 points)

Reason for biopsy: meets ACR TI-RADS criteria

Ultrasound imaging confirms appropriate placement of the needles
within the thyroid nodule.
IMPRESSION: 1. Technically successful ultrasound guided fine needle aspiration
of right mid lobe thyroid nodule
2. Technically successful ultrasound guided fine needle aspiration
of left superior lobe thyroid nodule

Read by

Ernst Po

## 2019-11-18 IMAGING — CR DG CHEST 2V
2 series · 2 of 2 positions shown · non-contrast
Comparison: No recent prior.

CLINICAL DATA: Preop exam for thyroid surgery.

EXAM:
CHEST - 2 VIEW

[w chest pa]
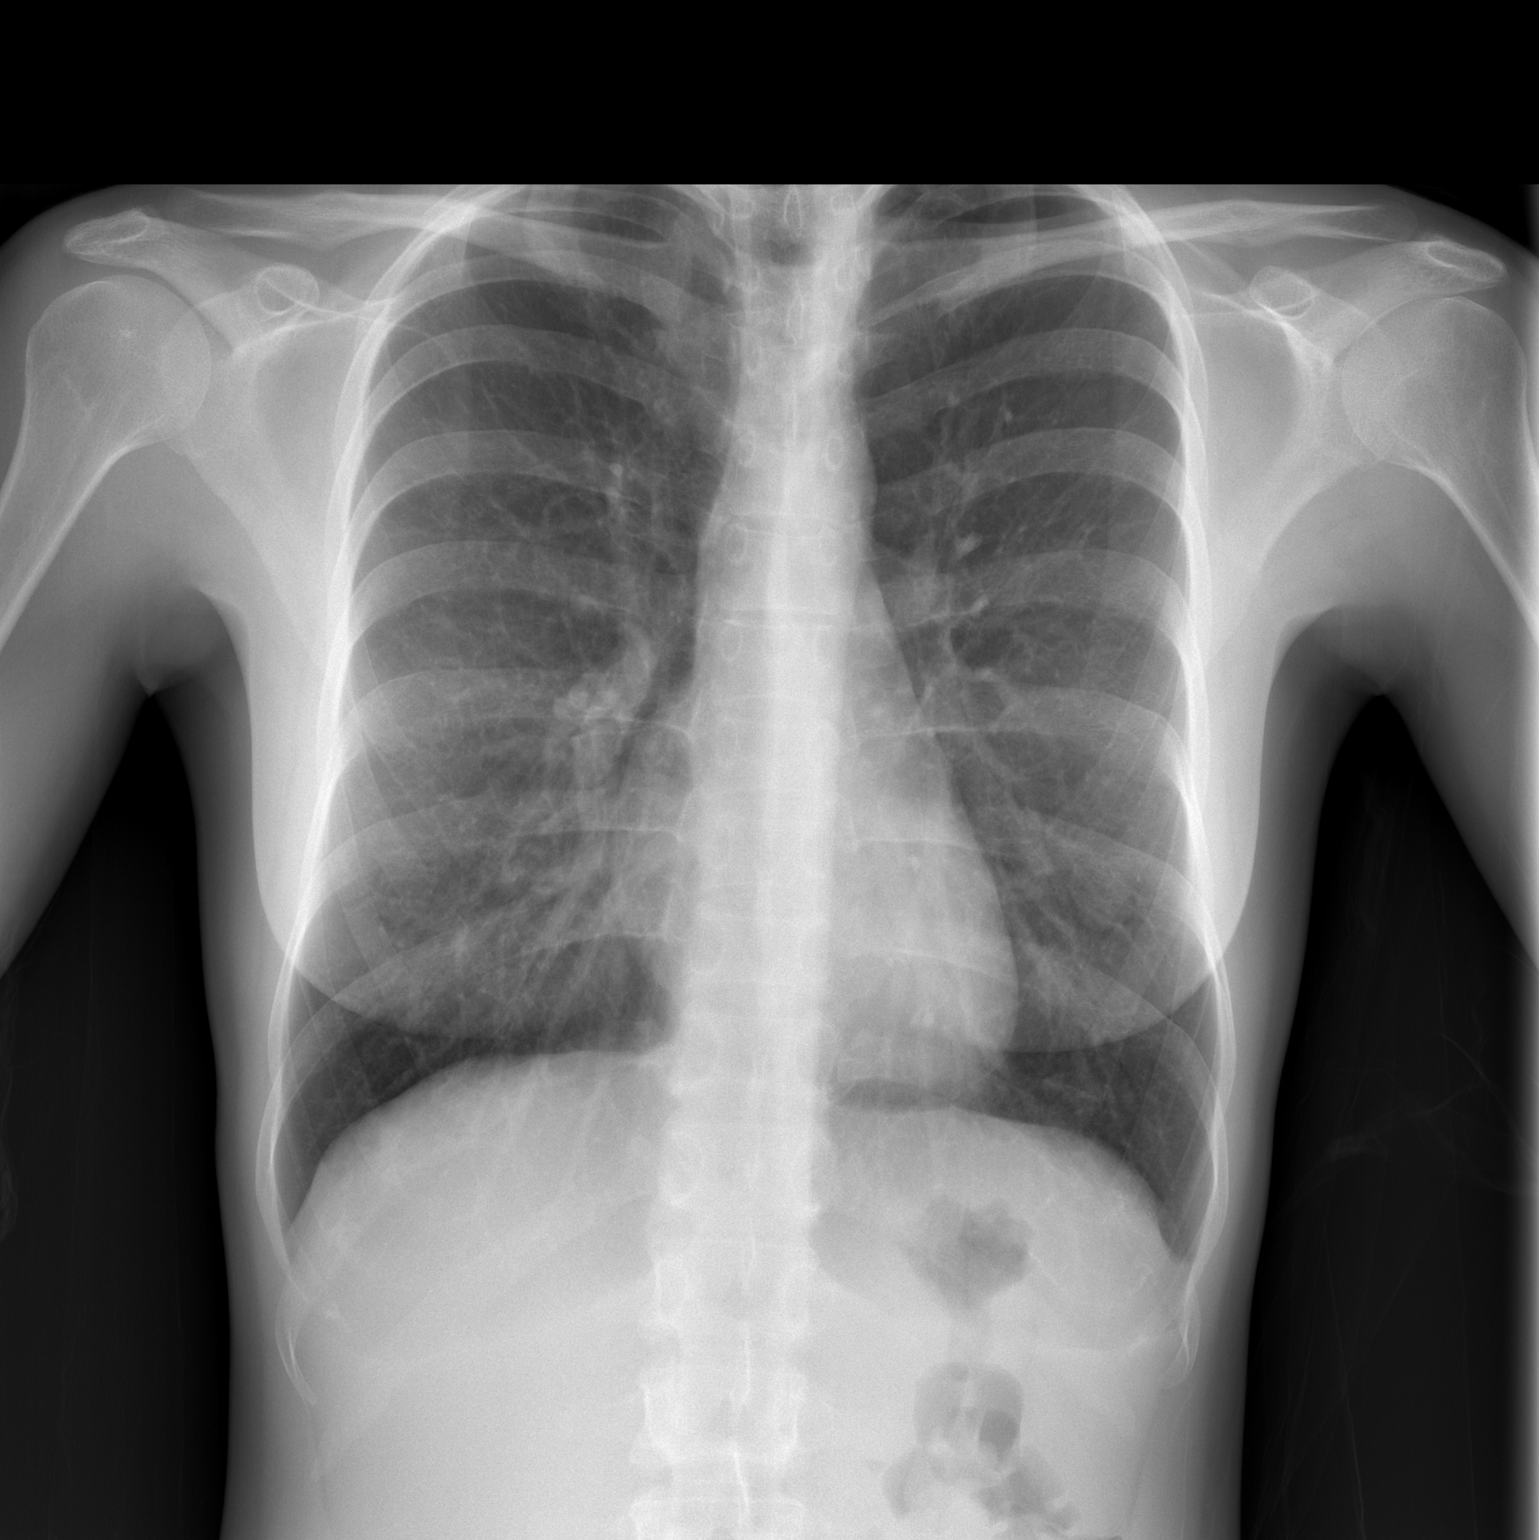

[w chest lat]
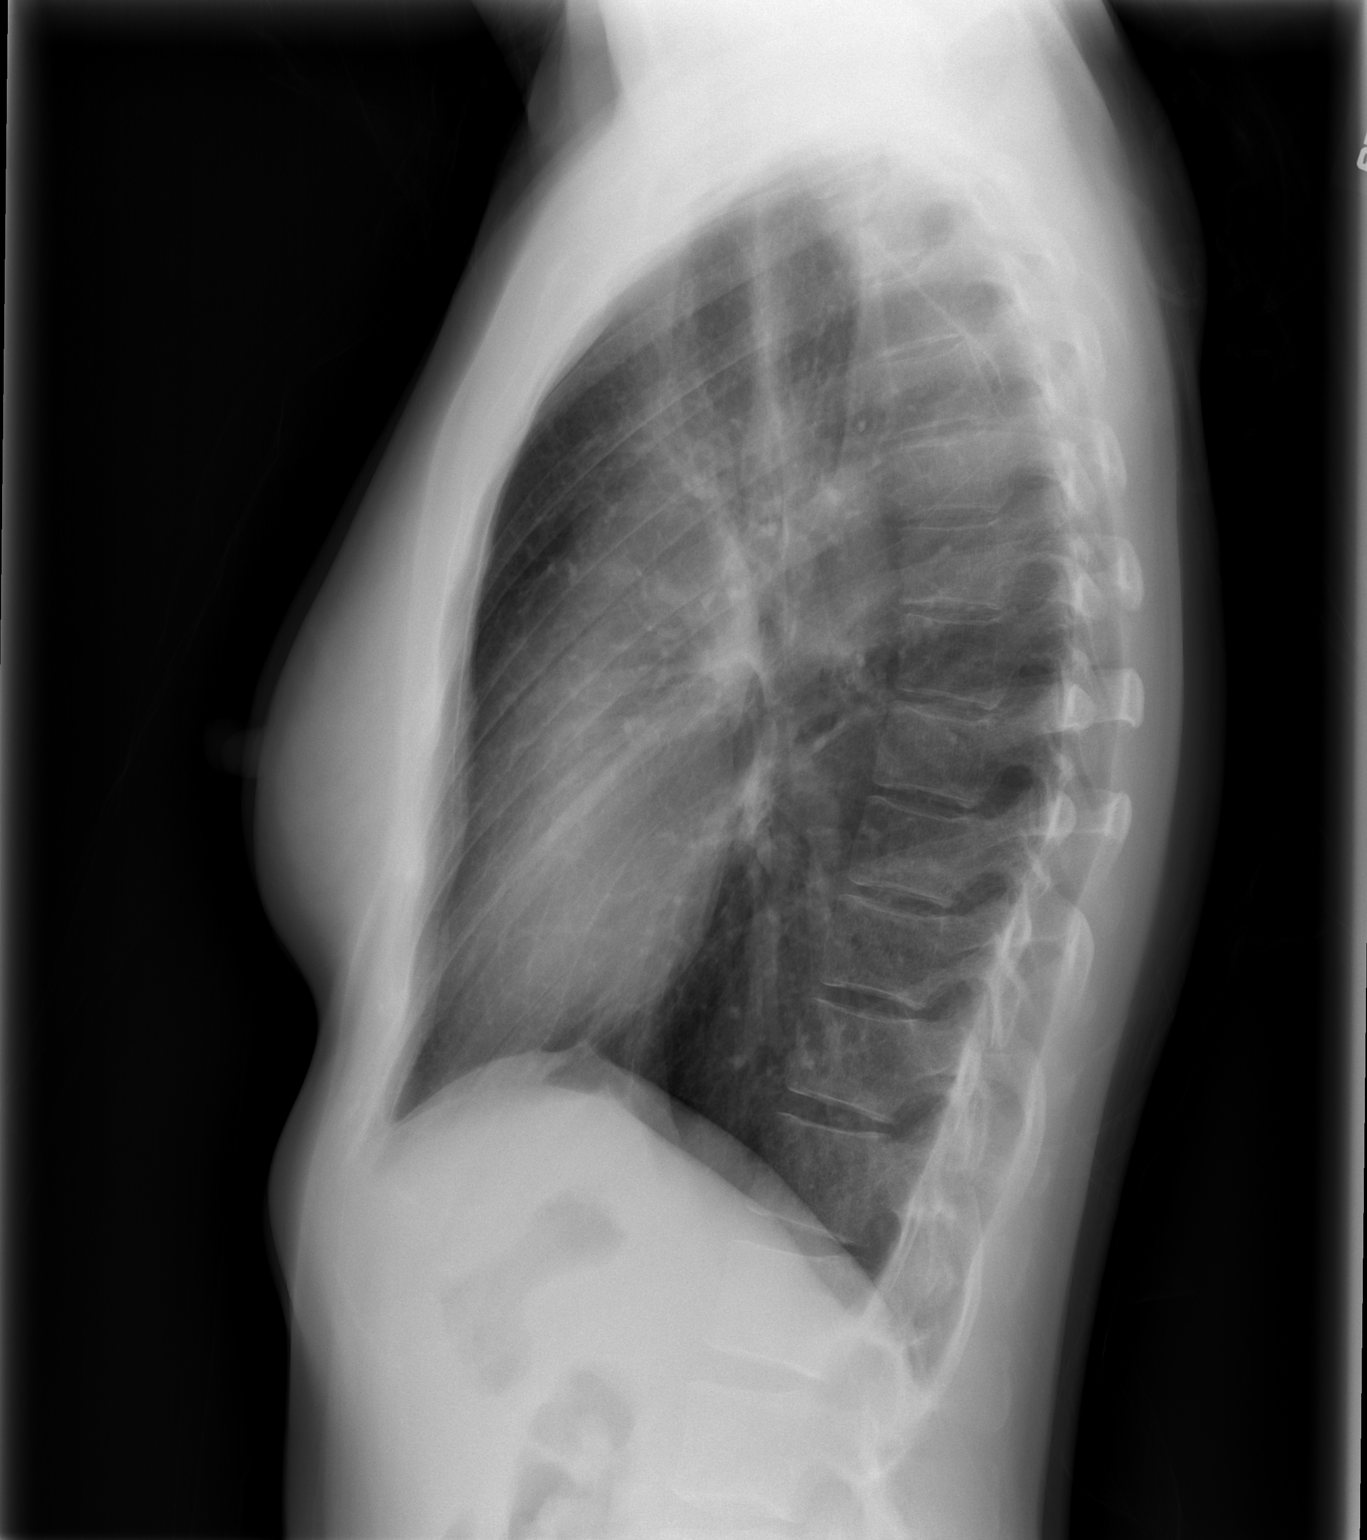

[2 of 2 positions shown; findings below may reference images not displayed]

FINDINGS: Mediastinum and hilar structures normal. Lungs are clear. No pleural
effusion or pneumothorax. Heart size normal. No acute bony
abnormality.
IMPRESSION: No acute cardiopulmonary disease.
